# Patient Record
Sex: Male | Born: 1980 | Race: Black or African American | Hispanic: No | Marital: Married | Smoking: Never smoker
Health system: Southern US, Community
[De-identification: ages and names within clinical notes are randomized; demographics above are authoritative.]

## PROBLEM LIST (undated history)

## (undated) DIAGNOSIS — R011 Cardiac murmur, unspecified: Secondary | ICD-10-CM

## (undated) DIAGNOSIS — I10 Essential (primary) hypertension: Secondary | ICD-10-CM

---

## 2004-09-24 ENCOUNTER — Emergency Department (HOSPITAL_COMMUNITY): Admission: EM | Admit: 2004-09-24 | Discharge: 2004-09-24 | Payer: Self-pay

## 2004-11-23 ENCOUNTER — Emergency Department (HOSPITAL_COMMUNITY): Admission: EM | Admit: 2004-11-23 | Discharge: 2004-11-23 | Payer: Self-pay | Admitting: Emergency Medicine

## 2012-05-30 ENCOUNTER — Encounter (HOSPITAL_COMMUNITY): Payer: Self-pay | Admitting: Emergency Medicine

## 2012-05-30 ENCOUNTER — Emergency Department (HOSPITAL_COMMUNITY)
Admission: EM | Admit: 2012-05-30 | Discharge: 2012-05-31 | Disposition: A | Discharge status: Home / Self Care | Payer: Self-pay | Attending: Emergency Medicine | Admitting: Emergency Medicine

## 2012-05-30 DIAGNOSIS — X500XXA Overexertion from strenuous movement or load, initial encounter: Secondary | ICD-10-CM | POA: Insufficient documentation

## 2012-05-30 DIAGNOSIS — M940 Chondrocostal junction syndrome [Tietze]: Secondary | ICD-10-CM | POA: Insufficient documentation

## 2012-05-30 HISTORY — DX: Cardiac murmur, unspecified: R01.1

## 2012-05-30 HISTORY — DX: Essential (primary) hypertension: I10

## 2012-05-30 LAB — URINALYSIS, ROUTINE W REFLEX MICROSCOPIC
Ketones, ur: NEGATIVE mg/dL
Leukocytes, UA: NEGATIVE
Protein, ur: NEGATIVE mg/dL
Urobilinogen, UA: 1 mg/dL (ref 0.0–1.0)

## 2012-05-30 NOTE — ED Notes (Signed)
Pt states he is having chest pain in the middle of his chest that started about 45 mins ago  Pt describes it as a soreness and states he has had some dizziness associated with the pain  Pt has hx of hypertension but has not been checked for it in a while

## 2012-05-31 ENCOUNTER — Emergency Department (HOSPITAL_COMMUNITY): Payer: Self-pay

## 2012-05-31 MED ORDER — IBUPROFEN 800 MG PO TABS: 800.0000 mg | ORAL_TABLET | Freq: Three times a day (TID) | ORAL | Status: DC

## 2012-05-31 MED ORDER — KETOROLAC TROMETHAMINE 60 MG/2ML IM SOLN
60.0000 mg | Freq: Once | INTRAMUSCULAR | Status: AC
  Administered 2012-05-31: 60 mg via INTRAMUSCULAR
  Filled 2012-05-31: qty 2

## 2012-05-31 NOTE — ED Provider Notes (Signed)
History     CSN: [id number]  Arrival date & time 05/30/12  2242   First MD Initiated Contact with Patient 05/30/12 2305      Chief Complaint  Patient presents with  . Chest Pain    (Consider location/radiation/quality/duration/timing/severity/associated sxs/prior treatment) HPI History provided by pt.   Pt had acute onset pressure-like pain at LSB at rest approx 2 hours ago.  Pain has been intermittent ever since.  It is not seem to be associated w/ movement, position or eating and is non-pleuritic.  Has also had SOB.  Denies fever, cough, abd pain, nausea, sweats.  Pt has h/o diet-controlled HTN.  He does not smoke and no FH early MI.  No RF for PE and denies LE pain/edema.  No recent trauma.  Most recently lifted weights at gym 2 days ago.  Past Medical History  Diagnosis Date  . Hypertension   . Heart murmur     History reviewed. No pertinent past surgical history.  Family History  Problem Relation Age of Onset  . Hypertension Other   . Diabetes Other     History  Substance Use Topics  . Smoking status: Never Smoker   . Smokeless tobacco: Not on file  . Alcohol Use: No      Review of Systems  All other systems reviewed and are negative.    Allergies  Review of patient's allergies indicates no known allergies.  Home Medications  No current outpatient prescriptions on file.  BP 165/84  Pulse 75  Temp 99.1 F (37.3 C) (Oral)  Resp 20  SpO2 100%  Physical Exam  Nursing note and vitals reviewed. Constitutional: He is oriented to person, place, and time. He appears well-developed and well-nourished. No distress.  HENT:  Head: Normocephalic and atraumatic.  Eyes:       Normal appearance  Neck: Normal range of motion.  Cardiovascular: Normal rate, regular rhythm and intact distal pulses.   Pulmonary/Chest: Effort normal and breath sounds normal. No respiratory distress.       No pleuritic pain reported.  Tenderness at LSB.  Mild increase in pain w/  twisting of torso.  Abdominal: Soft. Bowel sounds are normal. He exhibits no distension. There is no tenderness. There is no guarding.  Musculoskeletal: Normal range of motion.       No peripheral edema or calf tenderness  Neurological: He is alert and oriented to person, place, and time.  Skin: Skin is warm and dry. No rash noted.  Psychiatric: He has a normal mood and affect. His behavior is normal.    ED Course  Procedures (including critical care time)   Date: 05/31/2012  Rate: 67  Rhythm: normal sinus rhythm  QRS Axis: normal  Intervals: normal  ST/T Wave abnormalities: normal  Conduction Disutrbances:none  Narrative Interpretation:   Old EKG Reviewed: none available    Labs Reviewed  URINALYSIS, ROUTINE W REFLEX MICROSCOPIC   Dg Chest 2 View  05/31/2012  *RADIOLOGY REPORT*  Clinical Data: Shortness of breath, hypertension.  CHEST - 2 VIEW  Comparison: None.  Findings: Lungs clear.  Heart size and pulmonary vascularity normal.  No effusion.  Visualized bones unremarkable.  IMPRESSION: No acute disease   Original Report Authenticated By: Thora Lance III, M.D.      1. Costochondritis, acute       MDM  Healthy 31yo M presents w/ non-exertional CP and SOB x 2 hours.  Low risk ACS and no RF for or exam findings consistent w/ PE.  Recently lifted weights.  Pain reproducible w/ palpation at LSB as well as twisting of torso on exam.  EKG non-ischemic.  CXR pending.  Suspect costochondritis.  60mg  IM toradol ordered.  Will reassess shortly.  12:24 AM   CXR neg.  All results discussed w/ pt.  He reports that his pain is much improved w/ toradol.  VSS.  Will treat symptomatically w/ rest, ice and NSAID.  Strict return precautions discussed.  1:21 AM        Otilio Miu, PA 05/31/12 [phone]

## 2012-05-31 NOTE — ED Notes (Signed)
PA at bedside.

## 2012-05-31 NOTE — ED Provider Notes (Signed)
Medical screening examination/treatment/procedure(s) were performed by non-physician practitioner and as supervising physician I was immediately available for consultation/collaboration.   Hanley Seamen, MD 05/31/12 [phone]

## 2012-10-20 ENCOUNTER — Emergency Department (HOSPITAL_COMMUNITY)
Admission: EM | Admit: 2012-10-20 | Discharge: 2012-10-21 | Disposition: A | Discharge status: Home / Self Care | Payer: 59 | Attending: Emergency Medicine | Admitting: Emergency Medicine

## 2012-10-20 ENCOUNTER — Encounter (HOSPITAL_COMMUNITY): Payer: Self-pay | Admitting: *Deleted

## 2012-10-20 DIAGNOSIS — R5383 Other fatigue: Secondary | ICD-10-CM | POA: Insufficient documentation

## 2012-10-20 DIAGNOSIS — IMO0001 Reserved for inherently not codable concepts without codable children: Secondary | ICD-10-CM

## 2012-10-20 DIAGNOSIS — R011 Cardiac murmur, unspecified: Secondary | ICD-10-CM | POA: Insufficient documentation

## 2012-10-20 DIAGNOSIS — R209 Unspecified disturbances of skin sensation: Secondary | ICD-10-CM | POA: Insufficient documentation

## 2012-10-20 DIAGNOSIS — R5381 Other malaise: Secondary | ICD-10-CM | POA: Insufficient documentation

## 2012-10-20 DIAGNOSIS — I10 Essential (primary) hypertension: Secondary | ICD-10-CM | POA: Insufficient documentation

## 2012-10-20 NOTE — ED Provider Notes (Signed)
History  This chart was scribed for Ebbie Ridge, non-physician practitioner, working with Celene Kras, MD by Bennett Scrape, ED Scribe. This patient was seen in room WTR8/WTR8 and the patient's care was started at 11:10 PM.  CSN: [id number]  Arrival date & time 10/20/12  2151   First MD Initiated Contact with Patient 10/20/12 2310      Chief Complaint  Patient presents with  . Hypertension     The history is provided by the patient. No language interpreter was used.    Cody Lin is a 32 y.o. male who presents to the Emergency Department complaining of HTN of 193/73 that he noticed at a pharmacy this evening with associated hand tingling and fatigued over the past few days. He denies having a h/o HTN and denies being on HTN medications currently. He denies having a PCP currently and denies having followed up with a MD over the past few years. He denies CP, SOB, HA, urinary symptoms and visual disturbance as associated symptoms. He does not have a h/o chronic medical conditions and denies smoking and alcohol use.    Past Medical History  Diagnosis Date  . Hypertension   . Heart murmur     History reviewed. No pertinent past surgical history.  Family History  Problem Relation Age of Onset  . Hypertension Other   . Diabetes Other     History  Substance Use Topics  . Smoking status: Never Smoker   . Smokeless tobacco: Not on file  . Alcohol Use: No      Review of Systems  A complete 10 system review of systems was obtained and all systems are negative except as noted in the HPI and PMH.   Allergies  Review of patient's allergies indicates no known allergies.  Home Medications   Current Outpatient Rx  Name  Route  Sig  Dispense  Refill  . ibuprofen (ADVIL,MOTRIN) 800 MG tablet   Oral   Take 400 mg by mouth every 6 (six) hours as needed. Pain           Triage Vitals: BP 164/91  Pulse 71  Temp(Src) 98.8 F (37.1 C) (Oral)  Resp 18  Ht 5\' 9"  (1.753 m)   Wt 178 lb (80.74 kg)  BMI 26.27 kg/m2  SpO2 100%  Physical Exam  Nursing note and vitals reviewed. Constitutional: He is oriented to person, place, and time. He appears well-developed and well-nourished. No distress.  HENT:  Head: Normocephalic and atraumatic.  Mouth/Throat: Oropharynx is clear and moist.  Eyes: Conjunctivae and EOM are normal. Pupils are equal, round, and reactive to light.  Neck: Neck supple. No tracheal deviation present.  Cardiovascular: Normal rate and regular rhythm.   Pulmonary/Chest: Effort normal and breath sounds normal. No respiratory distress.  Abdominal: Soft. There is no tenderness.  Musculoskeletal: Normal range of motion.  Neurological: He is alert and oriented to person, place, and time.  Skin: Skin is warm and dry.  Psychiatric: He has a normal mood and affect. His behavior is normal.    ED Course  Procedures (including critical care time)  DIAGNOSTIC STUDIES: Oxygen Saturation is 100% on room air, normal by my interpretation.    COORDINATION OF CARE: 11:35 PM-Discussed treatment plan which includes CBC with pt at bedside and pt agreed to plan. Advised pt that this is a problem that he needs to follow up with a PCP for and will proved pt with a list of resources. Also advised pt to walk three  times a week and watch salt content.   Filed Vitals:   10/20/12 2203  BP: 164/91  Pulse: 71  Temp: 98.8 F (37.1 C)  TempSrc: Oral  Resp: 18  Height: 5\' 9"  (1.753 m)  Weight: 178 lb (80.74 kg)  SpO2: 100%   The patient is advised to have follow up with a PCP. The patient is advised to return here as needed. The patient does not have any signs of hypertensive crisis or emergency. Told to start moderate exercise.    MDM  I personally performed the services described in this documentation, which was scribed in my presence. The recorded information has been reviewed and is accurate.      Carlyle Dolly, PA-C 10/21/12 [phone]

## 2012-10-20 NOTE — ED Notes (Signed)
Pt reports noting a BP 193/73 PTA while at a pharmacy this evening - pt states since starting Saturday he has been experiencing intermittent generalized body tingling.

## 2012-10-21 LAB — URINALYSIS, ROUTINE W REFLEX MICROSCOPIC
Bilirubin Urine: NEGATIVE
Glucose, UA: NEGATIVE mg/dL
Hgb urine dipstick: NEGATIVE
Ketones, ur: NEGATIVE mg/dL
Leukocytes, UA: NEGATIVE
Nitrite: NEGATIVE
Protein, ur: NEGATIVE mg/dL
Specific Gravity, Urine: 1.025 (ref 1.005–1.030)
Urobilinogen, UA: 1 mg/dL (ref 0.0–1.0)
pH: 6.5 (ref 5.0–8.0)

## 2012-10-21 LAB — POCT I-STAT, CHEM 8
BUN: 16 mg/dL (ref 6–23)
Calcium, Ion: 1.23 mmol/L (ref 1.12–1.23)
Chloride: 103 mEq/L (ref 96–112)
Creatinine, Ser: 1.1 mg/dL (ref 0.50–1.35)
Glucose, Bld: 93 mg/dL (ref 70–99)
HCT: 49 % (ref 39.0–52.0)
Hemoglobin: 16.7 g/dL (ref 13.0–17.0)
Potassium: 4 mEq/L (ref 3.5–5.1)
Sodium: 143 mEq/L (ref 135–145)
TCO2: 33 mmol/L (ref 0–100)

## 2012-10-21 NOTE — ED Provider Notes (Signed)
Medical screening examination/treatment/procedure(s) were performed by non-physician practitioner and as supervising physician I was immediately available for consultation/collaboration.    Celene Kras, MD 10/21/12 [phone]

## 2013-08-18 IMAGING — CR DG CHEST 2V
2 series · 2 of 2 positions shown · non-contrast
Comparison: None.

CLINICAL DATA: Shortness of breath, hypertension.

CHEST - 2 VIEW

[w chest pa]
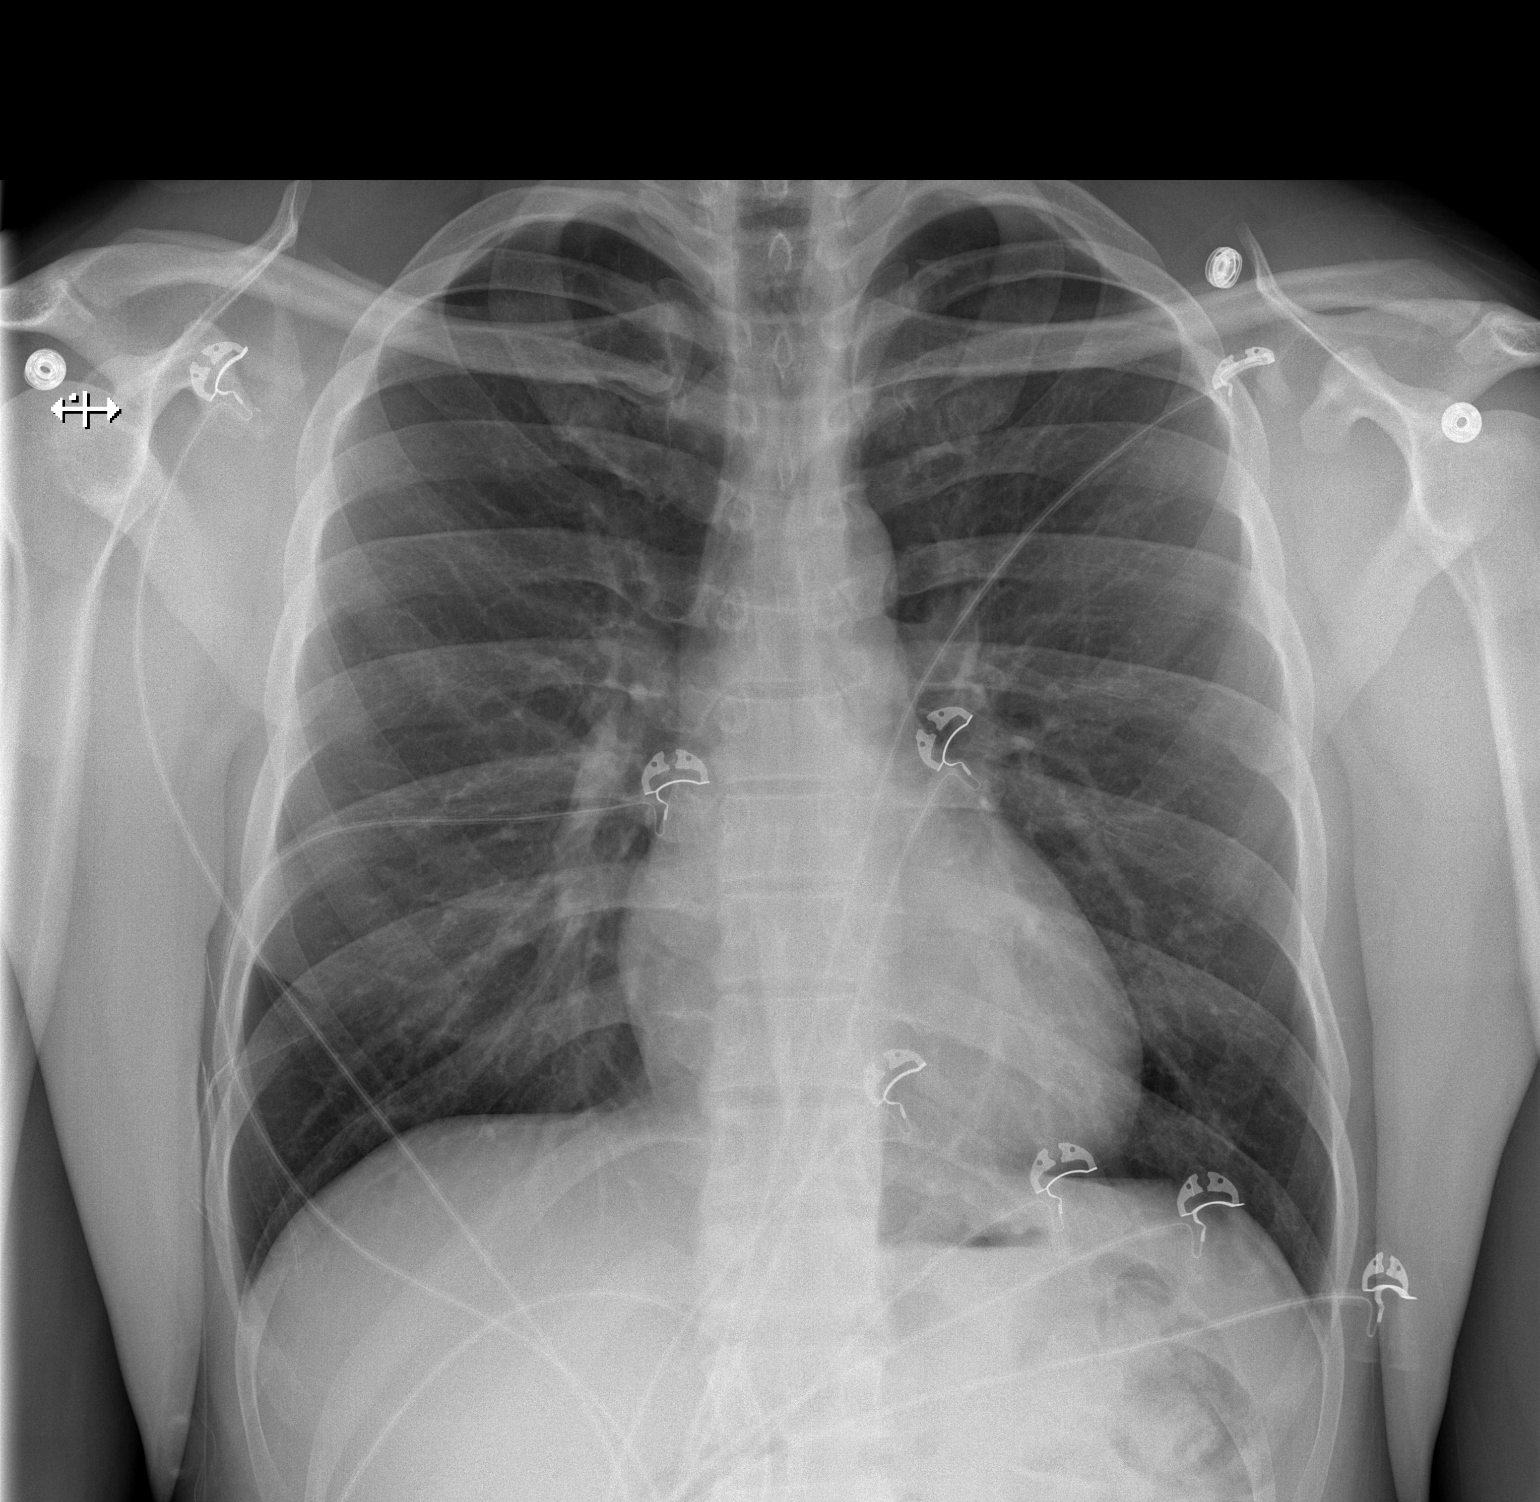

[w chest lat]
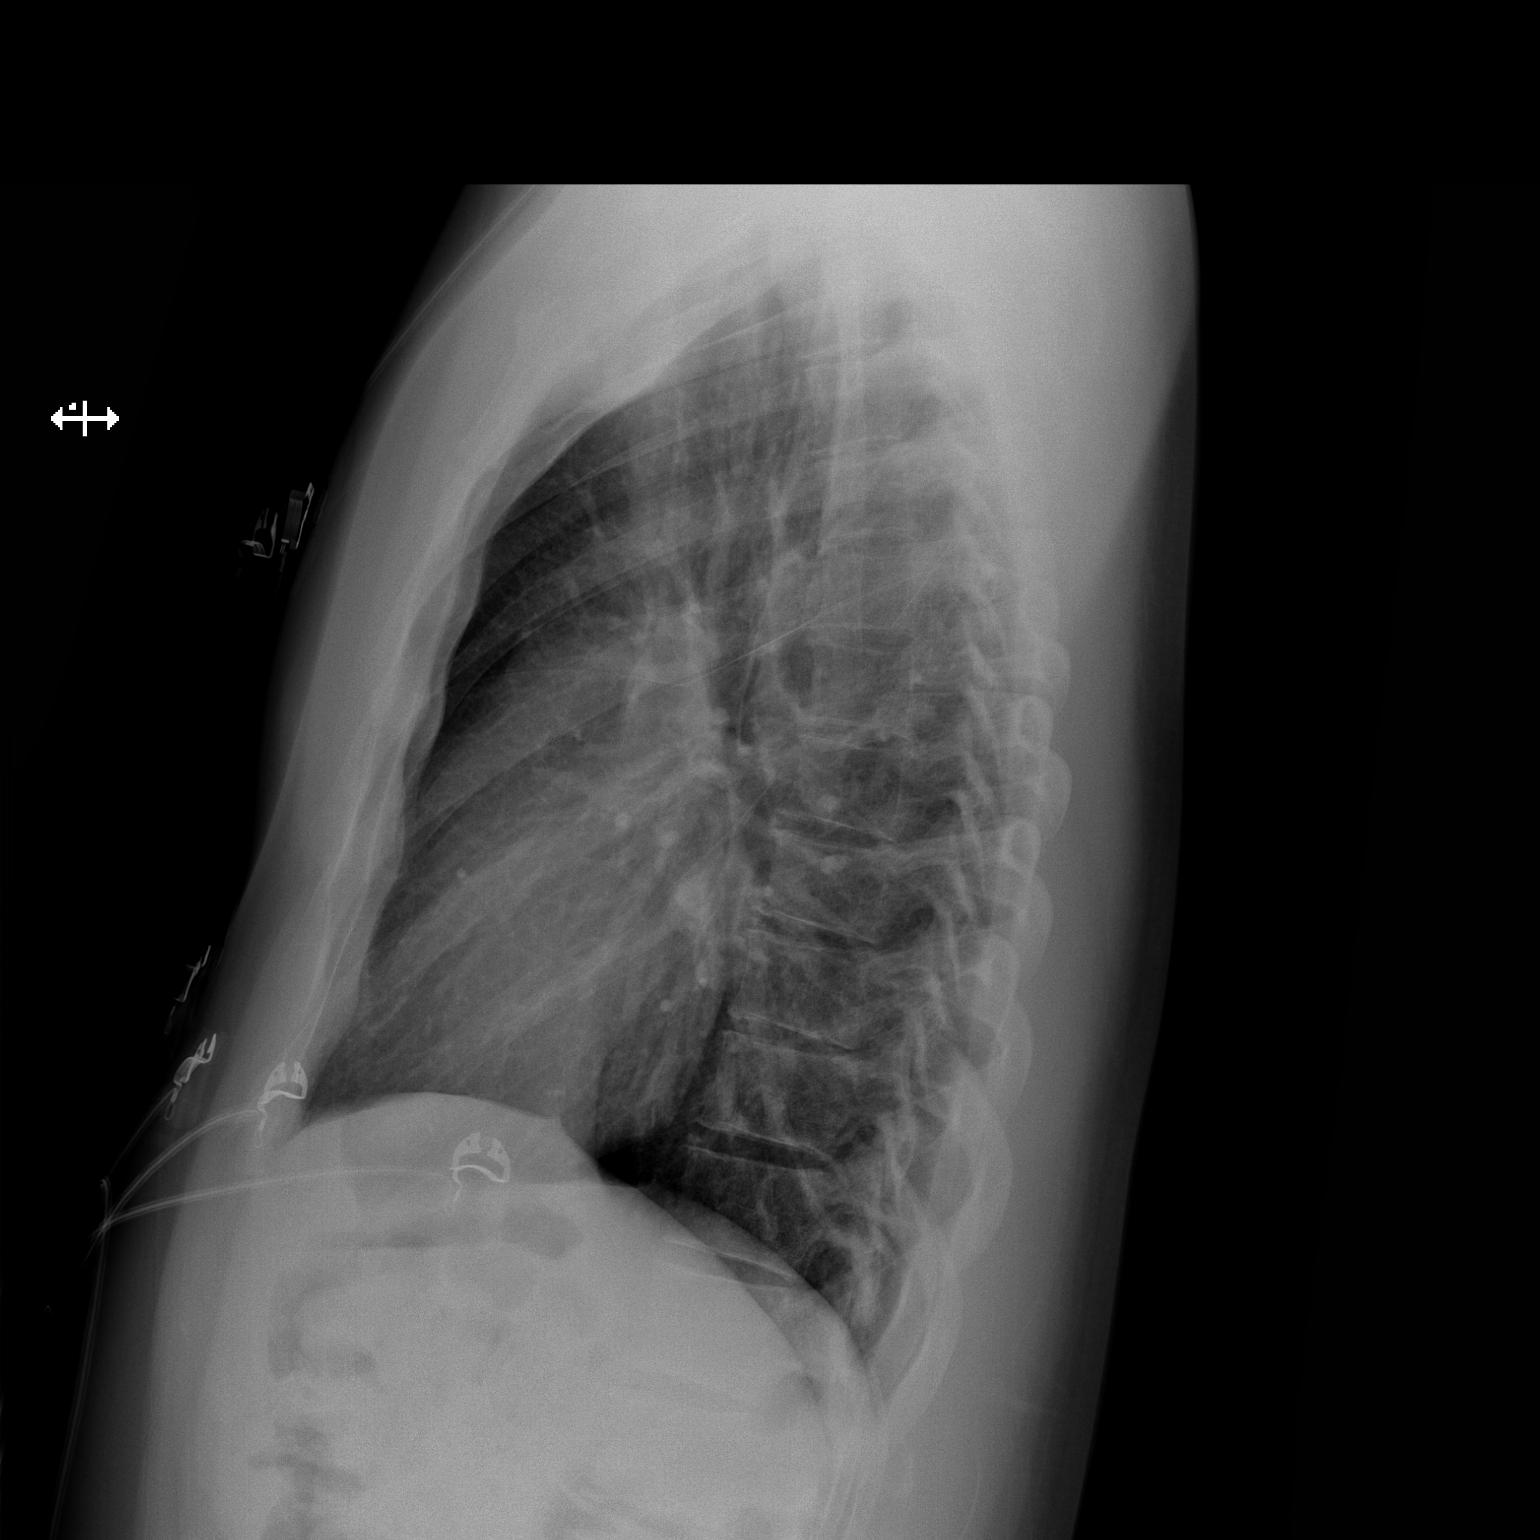

[2 of 2 positions shown; findings below may reference images not displayed]

FINDINGS: Lungs clear.  Heart size and pulmonary vascularity
normal.  No effusion.  Visualized bones unremarkable.
IMPRESSION: No acute disease

## 2014-04-27 ENCOUNTER — Emergency Department (HOSPITAL_COMMUNITY)
Admission: EM | Admit: 2014-04-27 | Discharge: 2014-04-27 | Disposition: A | Discharge status: Home / Self Care | Payer: 59 | Attending: Emergency Medicine | Admitting: Emergency Medicine

## 2014-04-27 ENCOUNTER — Emergency Department (HOSPITAL_COMMUNITY): Payer: 59

## 2014-04-27 ENCOUNTER — Encounter (HOSPITAL_COMMUNITY): Payer: Self-pay | Admitting: Emergency Medicine

## 2014-04-27 DIAGNOSIS — Z79899 Other long term (current) drug therapy: Secondary | ICD-10-CM | POA: Insufficient documentation

## 2014-04-27 DIAGNOSIS — I10 Essential (primary) hypertension: Secondary | ICD-10-CM | POA: Insufficient documentation

## 2014-04-27 DIAGNOSIS — R079 Chest pain, unspecified: Secondary | ICD-10-CM | POA: Insufficient documentation

## 2014-04-27 DIAGNOSIS — R011 Cardiac murmur, unspecified: Secondary | ICD-10-CM | POA: Diagnosis not present

## 2014-04-27 DIAGNOSIS — R071 Chest pain on breathing: Secondary | ICD-10-CM | POA: Insufficient documentation

## 2014-04-27 DIAGNOSIS — R0789 Other chest pain: Secondary | ICD-10-CM

## 2014-04-27 LAB — I-STAT TROPONIN, ED
TROPONIN I, POC: 0 ng/mL (ref 0.00–0.08)
Troponin i, poc: 0 ng/mL (ref 0.00–0.08)

## 2014-04-27 LAB — URINALYSIS, ROUTINE W REFLEX MICROSCOPIC
Bilirubin Urine: NEGATIVE
Glucose, UA: NEGATIVE mg/dL
Hgb urine dipstick: NEGATIVE
Ketones, ur: NEGATIVE mg/dL
Leukocytes, UA: NEGATIVE
Nitrite: NEGATIVE
Protein, ur: NEGATIVE mg/dL
Specific Gravity, Urine: 1.02 (ref 1.005–1.030)
Urobilinogen, UA: 0.2 mg/dL (ref 0.0–1.0)
pH: 7 (ref 5.0–8.0)

## 2014-04-27 LAB — BASIC METABOLIC PANEL
ANION GAP: 13 (ref 5–15)
BUN: 16 mg/dL (ref 6–23)
CO2: 28 meq/L (ref 19–32)
Calcium: 9.8 mg/dL (ref 8.4–10.5)
Chloride: 102 mEq/L (ref 96–112)
Creatinine, Ser: 1.13 mg/dL (ref 0.50–1.35)
GFR calc Af Amer: >90 mL/min (ref >90)
GFR calc non Af Amer: 83 mL/min — ABNORMAL LOW (ref >90)
GLUCOSE: 103 mg/dL — AB (ref 70–99)
POTASSIUM: 4.6 meq/L (ref 3.7–5.3)
SODIUM: 143 meq/L (ref 137–147)

## 2014-04-27 LAB — CBC
HCT: 48.9 % (ref 39.0–52.0)
Hemoglobin: 16.4 g/dL (ref 13.0–17.0)
MCH: 30.1 pg (ref 26.0–34.0)
MCHC: 33.5 g/dL (ref 30.0–36.0)
MCV: 89.9 fL (ref 78.0–100.0)
PLATELETS: 155 10*3/uL (ref 150–400)
RBC: 5.44 MIL/uL (ref 4.22–5.81)
RDW: 12.6 % (ref 11.5–15.5)
WBC: 7.2 10*3/uL (ref 4.0–10.5)

## 2014-04-27 MED ORDER — IBUPROFEN 800 MG PO TABS: 800.0000 mg | ORAL_TABLET | Freq: Three times a day (TID) | ORAL | Status: AC | PRN

## 2014-04-27 NOTE — ED Provider Notes (Signed)
Medical screening examination/treatment/procedure(s) were performed by non-physician practitioner and as supervising physician I was immediately available for consultation/collaboration.   EKG Interpretation   Date/Time:  Tuesday April 27 2014 11:40:49 EDT Ventricular Rate:  87 PR Interval:  134 QRS Duration: 101 QT Interval:  351 QTC Calculation: 422 R Axis:   73 Text Interpretation:  Sinus rhythm ST elev, probable normal early repol  pattern Baseline wander in lead(s) V2 V5 No significant change since last  tracing Confirmed by [facility]  MD, Quention Mcneill [phone]) on 04/27/2014 1:41:23 PM       Ethelda Chick, MD 04/27/14 1558

## 2014-04-27 NOTE — ED Provider Notes (Signed)
CSN: [id number]     Arrival date & time 04/27/14  1129 History   First MD Initiated Contact with Patient 04/27/14 1154     Chief Complaint  Patient presents with  . Chest Pain     (Consider location/radiation/quality/duration/timing/severity/associated sxs/prior Treatment) HPI Patient presents to the emergency department with right-sided chest pressure that started earlier today.  The patient, states, that it lasted about 15 minutes.  He states, that he still feels some pressure, but not significantly, like earlier.  Patient, states, that movement and palpation make the discomfort worse patient denies weakness, numbness, dizziness, headache, blurred vision, back pain, diaphoresis, back pain, neck pain, nausea, vomiting, diarrhea, abdominal pain, fever, cough, rash, body aches, or syncope.  The patient, states he did not take any medications prior to arrival.  Patient, states nothing seems make his condition, better Past Medical History  Diagnosis Date  . Hypertension   . Heart murmur    History reviewed. No pertinent past surgical history. Family History  Problem Relation Age of Onset  . Hypertension Other   . Diabetes Other    History  Substance Use Topics  . Smoking status: Never Smoker   . Smokeless tobacco: Not on file  . Alcohol Use: No    Review of Systems All other systems negative except as documented in the HPI. All pertinent positives and negatives as reviewed in the HPI.   Allergies  Review of patient's allergies indicates no known allergies.  Home Medications   Prior to Admission medications   Medication Sig Start Date End Date Taking? Authorizing Provider  cholecalciferol (VITAMIN D) 1000 UNITS tablet Take 1,000 Units by mouth daily.   Yes Historical Provider, MD  Omega-3 Fatty Acids (FISH OIL PO) Take 1 capsule by mouth daily.   Yes Historical Provider, MD   BP 128/68  Pulse 59  Temp(Src) 98.8 F (37.1 C) (Oral)  Resp 14  SpO2 100% Physical Exam   Nursing note and vitals reviewed. Constitutional: He is oriented to person, place, and time. He appears well-developed and well-nourished. No distress.  HENT:  Head: Normocephalic and atraumatic.  Mouth/Throat: Oropharynx is clear and moist.  Eyes: Pupils are equal, round, and reactive to light.  Neck: Normal range of motion. Neck supple.  Cardiovascular: Normal rate, regular rhythm and normal heart sounds.  Exam reveals no gallop and no friction rub.   No murmur heard. Pulmonary/Chest: Effort normal and breath sounds normal. No respiratory distress. He exhibits tenderness.  Abdominal: Soft. Bowel sounds are normal. He exhibits no distension. There is no tenderness.  Musculoskeletal: He exhibits no edema.  Neurological: He is alert and oriented to person, place, and time. No cranial nerve deficit. He exhibits normal muscle tone. Coordination normal.  Skin: Skin is warm and dry. No rash noted. No erythema.    ED Course  Procedures (including critical care time) Labs Review Labs Reviewed  BASIC METABOLIC PANEL - Abnormal; Notable for the following:    Glucose, Bld 103 (*)    GFR calc non Af Amer 83 (*)    All other components within normal limits  URINALYSIS, ROUTINE W REFLEX MICROSCOPIC - Abnormal; Notable for the following:    APPearance CLOUDY (*)    All other components within normal limits  CBC  I-STAT TROPOININ, ED  I-STAT TROPOININ, ED    Imaging Review Dg Chest 2 View  04/27/2014   CLINICAL DATA:  Chest pain and shortness of breath with history of hypertension and heart murmur  EXAM: CHEST  2 VIEW  COMPARISON:  PA and lateral chest of May 31, 2012  FINDINGS: The lungs are mildly hyperinflated. There is no focal infiltrate. The heart and mediastinal structures are normal. There is no pleural effusion or pneumothorax. The bony thorax is unremarkable.  IMPRESSION: Hyperinflation consistent with COPD or reactive airway disease. There is no evidence of pneumonia nor other acute  cardiopulmonary abnormality.   Electronically Signed   By: David  [place]   On: 04/27/2014 13:32     EKG Interpretation   Date/Time:  Tuesday April 27 2014 11:40:49 EDT Ventricular Rate:  87 PR Interval:  134 QRS Duration: 101 QT Interval:  351 QTC Calculation: 422 R Axis:   73 Text Interpretation:  Sinus rhythm ST elev, probable normal early repol  pattern Baseline wander in lead(s) V2 V5 No significant change since last  tracing Confirmed by [facility]  MD, MARTHA [phone]) on 04/27/2014 1:41:23 PM      Patient is PERC negative and low risk, based on Wells criteria.  The pain, seems to be reproducible on the right side of his chest.  Patient, states, that this pain is mostly gone and has been, since he's been here in the emergency department.  This mainly seems to be musculoskeletal chest pain, based on his history of present illness, and Physical exam, along with test results  Carlyle Dolly, PA-C 04/27/14 1550

## 2014-04-27 NOTE — Discharge Instructions (Signed)
Your testing here today, was normal.  Return here as needed.  Followup with her primary care Dr. or urgent care

## 2014-04-27 NOTE — ED Notes (Signed)
ptwas riding in the car 1 hour ago and began having chest pain Pressure to epig area. No n/v/d no diaphoretic, hx of murmur and had seen a cardiology a while back and it was doing fine. Pain radates to bil lower back pain with the chest pain. Was on lisinorpril and was stopped due to bp was ok.

## 2021-09-13 ENCOUNTER — Encounter (HOSPITAL_BASED_OUTPATIENT_CLINIC_OR_DEPARTMENT_OTHER): Payer: Self-pay | Admitting: Urology

## 2021-09-13 ENCOUNTER — Other Ambulatory Visit: Payer: Self-pay

## 2021-09-13 ENCOUNTER — Emergency Department (HOSPITAL_BASED_OUTPATIENT_CLINIC_OR_DEPARTMENT_OTHER): Payer: Self-pay

## 2021-09-13 ENCOUNTER — Emergency Department (HOSPITAL_BASED_OUTPATIENT_CLINIC_OR_DEPARTMENT_OTHER)
Admission: EM | Admit: 2021-09-13 | Discharge: 2021-09-13 | Disposition: A | Discharge status: Home / Self Care | Payer: Self-pay | Attending: Emergency Medicine | Admitting: Emergency Medicine

## 2021-09-13 DIAGNOSIS — R42 Dizziness and giddiness: Secondary | ICD-10-CM

## 2021-09-13 DIAGNOSIS — I10 Essential (primary) hypertension: Secondary | ICD-10-CM | POA: Insufficient documentation

## 2021-09-13 LAB — COMPREHENSIVE METABOLIC PANEL
ALT: 21 U/L (ref 0–44)
AST: 24 U/L (ref 15–41)
Albumin: 4.4 g/dL (ref 3.5–5.0)
Alkaline Phosphatase: 44 U/L (ref 38–126)
Anion gap: 9 (ref 5–15)
BUN: 22 mg/dL — ABNORMAL HIGH (ref 6–20)
CO2: 25 mmol/L (ref 22–32)
Calcium: 9.1 mg/dL (ref 8.9–10.3)
Chloride: 103 mmol/L (ref 98–111)
Creatinine, Ser: 1.1 mg/dL (ref 0.61–1.24)
GFR, Estimated: >60 mL/min (ref >60)
Glucose, Bld: 113 mg/dL — ABNORMAL HIGH (ref 70–99)
Potassium: 4 mmol/L (ref 3.5–5.1)
Sodium: 137 mmol/L (ref 135–145)
Total Bilirubin: 0.4 mg/dL (ref 0.3–1.2)
Total Protein: 7.4 g/dL (ref 6.5–8.1)

## 2021-09-13 LAB — CBC WITH DIFFERENTIAL/PLATELET
Abs Immature Granulocytes: 0.02 10*3/uL (ref 0.00–0.07)
Basophils Absolute: 0.1 10*3/uL (ref 0.0–0.1)
Basophils Relative: 1 %
Eosinophils Absolute: 0.2 10*3/uL (ref 0.0–0.5)
Eosinophils Relative: 2 %
HCT: 46.4 % (ref 39.0–52.0)
Hemoglobin: 15.6 g/dL (ref 13.0–17.0)
Immature Granulocytes: 0 %
Lymphocytes Relative: 19 %
Lymphs Abs: 1.7 10*3/uL (ref 0.7–4.0)
MCH: 29.8 pg (ref 26.0–34.0)
MCHC: 33.6 g/dL (ref 30.0–36.0)
MCV: 88.5 fL (ref 80.0–100.0)
Monocytes Absolute: 0.5 10*3/uL (ref 0.1–1.0)
Monocytes Relative: 6 %
Neutro Abs: 6.6 10*3/uL (ref 1.7–7.7)
Neutrophils Relative %: 72 %
Platelets: 161 10*3/uL (ref 150–400)
RBC: 5.24 MIL/uL (ref 4.22–5.81)
RDW: 12.8 % (ref 11.5–15.5)
WBC: 9 10*3/uL (ref 4.0–10.5)
nRBC: 0 % (ref 0.0–0.2)

## 2021-09-13 LAB — TROPONIN I (HIGH SENSITIVITY)
Troponin I (High Sensitivity): 6 ng/L (ref <18)
Troponin I (High Sensitivity): 9 ng/L (ref <18)

## 2021-09-13 MED ORDER — IOHEXOL 350 MG/ML SOLN
80.0000 mL | Freq: Once | INTRAVENOUS | Status: AC | PRN
  Administered 2021-09-13: 80 mL via INTRAVENOUS

## 2021-09-13 MED ORDER — HYDRALAZINE HCL 20 MG/ML IJ SOLN
INTRAMUSCULAR | Status: AC
  Filled 2021-09-13: qty 1

## 2021-09-13 MED ORDER — CLONIDINE HCL 0.2 MG PO TABS: 0.2000 mg | ORAL_TABLET | Freq: Every day | ORAL | 0 refills | Status: AC | PRN

## 2021-09-13 MED ORDER — HYDRALAZINE HCL 20 MG/ML IJ SOLN
5.0000 mg | Freq: Once | INTRAMUSCULAR | Status: AC
Start: 2021-09-13 — End: 2021-09-13
  Administered 2021-09-13: 5 mg via INTRAVENOUS

## 2021-09-13 NOTE — ED Triage Notes (Signed)
Pt states dizziness and light headedness approx 1 hr pta, states left arm pain at time of episode but not currently H/o HTN, BP at home was 190/106

## 2021-09-13 NOTE — Discharge Instructions (Addendum)
Your blood pressure was elevated on arrival and is normal now.  Please take clonidine if blood pressures greater than 160  Your CT scans and lab work were unremarkable today.  See your doctor for follow-up in a week to recheck your blood pressure  Return to ER if you have worse dizziness, chest pain, persistent hypertension

## 2021-09-13 NOTE — ED Notes (Signed)
Discharge instructions discussed with pt. Pt verbalized understanding. Pt stable and ambulatory.  °

## 2021-09-13 NOTE — ED Provider Notes (Signed)
[facility] EMERGENCY DEPARTMENT Provider Note   CSN: [id number] Arrival date & time: 09/13/21  1944     History  Chief Complaint  Patient presents with   Dizziness    Cody Lin is a 41 y.o. male here presenting with dizziness and hypertension and left-sided arm pain.  Patient states that he was at his son's jujitsu class around 6:00.  He states that he has sudden onset of headache and dizziness when he was driving back.  He then had some left arm pain.  Denies any numbness or weakness.  Patient took his blood pressure and it was elevated around 190.  Patient has no history of hypertension and is not on any BP meds.  Patient denies any chest pain or back pain or abdominal pain.  The history is provided by the patient.      Home Medications Prior to Admission medications   Medication Sig Start Date End Date Taking? Authorizing Provider  cholecalciferol (VITAMIN D) 1000 UNITS tablet Take 1,000 Units by mouth daily.    [provider]  ibuprofen (ADVIL,MOTRIN) 800 MG tablet Take 1 tablet (800 mg total) by mouth every 8 (eight) hours as needed. 04/27/14   Lawyer, Cristal Deer, PA-C  Omega-3 Fatty Acids (FISH OIL PO) Take 1 capsule by mouth daily.    [provider]      Allergies    Patient has no known allergies.    Review of Systems   Review of Systems  Neurological:  Positive for dizziness.  All other systems reviewed and are negative.  Physical Exam Updated Vital Signs BP (!) 162/92 (BP Location: Left Arm)    Pulse 69    Temp 98.1 F (36.7 C) (Oral)    Resp 18    Ht 5\' 9"  (1.753 m)    Wt 83.9 kg    SpO2 100%    BMI 27.32 kg/m  Physical Exam Vitals and nursing note reviewed.  Constitutional:      Appearance: Normal appearance.  HENT:     Head: Normocephalic.     Nose: Nose normal.     Mouth/Throat:     Mouth: Mucous membranes are moist.  Eyes:     Extraocular Movements: Extraocular movements intact.     Pupils: Pupils are equal, round,  and reactive to light.  Cardiovascular:     Rate and Rhythm: Normal rate and regular rhythm.     Pulses: Normal pulses.     Heart sounds: Normal heart sounds.  Pulmonary:     Effort: Pulmonary effort is normal.     Breath sounds: Normal breath sounds.  Abdominal:     General: Abdomen is flat.     Palpations: Abdomen is soft.  Musculoskeletal:        General: Normal range of motion.     Cervical back: Normal range of motion and neck supple.     Comments: Good peripheral pulses throughout  Skin:    General: Skin is warm.     Capillary Refill: Capillary refill takes less than 2 seconds.  Neurological:     General: No focal deficit present.     Mental Status: He is alert and oriented to person, place, and time.  Psychiatric:        Mood and Affect: Mood normal.        Behavior: Behavior normal.    ED Results / Procedures / Treatments   Labs (all labs ordered are listed, but only abnormal results are displayed) Labs Reviewed  CBC WITH DIFFERENTIAL/PLATELET  COMPREHENSIVE METABOLIC PANEL  TROPONIN I (HIGH SENSITIVITY)    EKG EKG Interpretation  Date/Time:  Wednesday September 13 2021 19:56:10 EST Ventricular Rate:  60 PR Interval:  157 QRS Duration: 109 QT Interval:  401 QTC Calculation: 401 R Axis:   27 Text Interpretation: Sinus rhythm ST elev, probable normal early repol pattern No significant change since last tracing Confirmed by Richardean Canal [phone]) on 09/13/2021 8:08:13 PM  Radiology No results found.  Procedures Procedures    Medications Ordered in ED Medications  hydrALAZINE (APRESOLINE) injection 5 mg (has no administration in time range)    ED Course/ Medical Decision Making/ A&P                           Medical Decision Making Cody Lin is a 41 y.o. male here presenting with sudden onset of hypertension and dizziness and left arm pain.  Likely symptomatic hypertension but also consider hypertensive urgency versus emergency and also carotid  dissection and also ACS.  Plan to get CBC and CMP and troponin x2, CTA head and neck.  Plan to give IV hydralazine for elevated blood pressure.  11:09 PM Patient blood pressure is down to the low 100s now.  Troponin negative x2.  CTA showed no dissection.  Patient is stable for discharge now.  Since patient's blood pressure is normal now, patient may discharge with clonidine only as needed.  We will have him follow-up with PCP in a week to recheck blood pressure  Problems Addressed: Dizziness: acute illness or injury Hypertension, unspecified type: acute illness or injury  Amount and/or Complexity of Data Reviewed External Data Reviewed: notes. Labs: ordered. Decision-making details documented in ED Course. Radiology: ordered and independent interpretation performed. Decision-making details documented in ED Course. ECG/medicine tests: ordered and independent interpretation performed. Decision-making details documented in ED Course.  Risk Prescription drug management.   Final Clinical Impression(s) / ED Diagnoses Final diagnoses:  Dizziness  Hypertension, unspecified type    Rx / DC Orders ED Discharge Orders     None         Charlynne Pander, MD 09/13/21 2312

## 2021-11-30 ENCOUNTER — Emergency Department (HOSPITAL_BASED_OUTPATIENT_CLINIC_OR_DEPARTMENT_OTHER)
Admission: EM | Admit: 2021-11-30 | Discharge: 2021-11-30 | Disposition: A | Discharge status: Home / Self Care | Payer: Self-pay | Attending: Emergency Medicine | Admitting: Emergency Medicine

## 2021-11-30 ENCOUNTER — Emergency Department (HOSPITAL_BASED_OUTPATIENT_CLINIC_OR_DEPARTMENT_OTHER): Payer: Self-pay

## 2021-11-30 ENCOUNTER — Other Ambulatory Visit: Payer: Self-pay

## 2021-11-30 ENCOUNTER — Encounter (HOSPITAL_BASED_OUTPATIENT_CLINIC_OR_DEPARTMENT_OTHER): Payer: Self-pay | Admitting: Emergency Medicine

## 2021-11-30 DIAGNOSIS — R072 Precordial pain: Secondary | ICD-10-CM | POA: Insufficient documentation

## 2021-11-30 DIAGNOSIS — R079 Chest pain, unspecified: Secondary | ICD-10-CM

## 2021-11-30 LAB — BASIC METABOLIC PANEL
Anion gap: 7 (ref 5–15)
BUN: 15 mg/dL (ref 6–20)
CO2: 27 mmol/L (ref 22–32)
Calcium: 9.2 mg/dL (ref 8.9–10.3)
Chloride: 104 mmol/L (ref 98–111)
Creatinine, Ser: 1.25 mg/dL — ABNORMAL HIGH (ref 0.61–1.24)
GFR, Estimated: >60 mL/min (ref >60)
Glucose, Bld: 154 mg/dL — ABNORMAL HIGH (ref 70–99)
Potassium: 3.9 mmol/L (ref 3.5–5.1)
Sodium: 138 mmol/L (ref 135–145)

## 2021-11-30 LAB — CBC WITH DIFFERENTIAL/PLATELET
Abs Immature Granulocytes: 0.02 10*3/uL (ref 0.00–0.07)
Basophils Absolute: 0 10*3/uL (ref 0.0–0.1)
Basophils Relative: 0 %
Eosinophils Absolute: 0.1 10*3/uL (ref 0.0–0.5)
Eosinophils Relative: 1 %
HCT: 45.3 % (ref 39.0–52.0)
Hemoglobin: 15.7 g/dL (ref 13.0–17.0)
Immature Granulocytes: 0 %
Lymphocytes Relative: 23 %
Lymphs Abs: 2 10*3/uL (ref 0.7–4.0)
MCH: 30.4 pg (ref 26.0–34.0)
MCHC: 34.7 g/dL (ref 30.0–36.0)
MCV: 87.8 fL (ref 80.0–100.0)
Monocytes Absolute: 0.4 10*3/uL (ref 0.1–1.0)
Monocytes Relative: 5 %
Neutro Abs: 6.4 10*3/uL (ref 1.7–7.7)
Neutrophils Relative %: 71 %
Platelets: 187 10*3/uL (ref 150–400)
RBC: 5.16 MIL/uL (ref 4.22–5.81)
RDW: 12.3 % (ref 11.5–15.5)
WBC: 8.9 10*3/uL (ref 4.0–10.5)
nRBC: 0 % (ref 0.0–0.2)

## 2021-11-30 LAB — TROPONIN I (HIGH SENSITIVITY): Troponin I (High Sensitivity): 5 ng/L (ref <18)

## 2021-11-30 MED ORDER — PANTOPRAZOLE SODIUM 20 MG PO TBEC: 20.0000 mg | DELAYED_RELEASE_TABLET | Freq: Every day | ORAL | 0 refills | Status: DC

## 2021-11-30 NOTE — ED Provider Notes (Signed)
?[facility] EMERGENCY DEPARTMENT ?Provider Note ? ? ?CSN: [id number] ?Arrival date & time: 11/30/21  1635 ? ?  ? ?History ? ?Chief Complaint  ?Patient presents with  ? Chest Pain  ? ? ?Tanveer Brammer is a 41 y.o. male. ? ?The history is provided by the patient.  ?Chest Pain ?Pain location:  Substernal area ?Pain quality: burning   ?Pain radiates to:  Does not radiate ?Pain severity:  Mild ?Onset quality:  Gradual ?Duration:  1 week ?Timing:  Intermittent ?Progression:  Waxing and waning ?Chronicity:  New ?Context: at rest   ?Relieved by:  Nothing ?Worsened by:  Nothing ?Associated symptoms: no abdominal pain, no altered mental status, no anorexia, no anxiety, no back pain, no claudication, no cough, no diaphoresis, no dizziness, no dysphagia, no fever, no headache, no numbness, no orthopnea, no palpitations, no PND, no shortness of breath, no syncope, no vomiting and no weakness   ?Risk factors: no coronary artery disease, no diabetes mellitus, no high cholesterol, no hypertension, no prior DVT/PE and no smoking   ? ?  ? ?Home Medications ?Prior to Admission medications   ?Medication Sig Start Date End Date Taking? Authorizing Provider  ?pantoprazole (PROTONIX) 20 MG tablet Take 1 tablet (20 mg total) by mouth daily. 11/30/21 12/30/21 Yes Kamren Heintzelman, DO  ?cholecalciferol (VITAMIN D) 1000 UNITS tablet Take 1,000 Units by mouth daily.    [provider]  ?cloNIDine (CATAPRES) 0.2 MG tablet Take 1 tablet (0.2 mg total) by mouth daily as needed (BP > 160). 09/13/21   Charlynne Pander, MD  ?ibuprofen (ADVIL,MOTRIN) 800 MG tablet Take 1 tablet (800 mg total) by mouth every 8 (eight) hours as needed. 04/27/14   Charlestine Night, PA-C  ?Omega-3 Fatty Acids (FISH OIL PO) Take 1 capsule by mouth daily.    [provider]  ?   ? ?Allergies    ?Patient has no known allergies.   ? ?Review of Systems   ?Review of Systems  ?Constitutional:  Negative for diaphoresis and fever.  ?HENT:  Negative for trouble  swallowing.   ?Respiratory:  Negative for cough and shortness of breath.   ?Cardiovascular:  Positive for chest pain. Negative for palpitations, orthopnea, claudication, syncope and PND.  ?Gastrointestinal:  Negative for abdominal pain, anorexia and vomiting.  ?Musculoskeletal:  Negative for back pain.  ?Neurological:  Negative for dizziness, weakness, numbness and headaches.  ? ?Physical Exam ?Updated Vital Signs ?BP 136/80   Pulse 63   Temp 99 ?F (37.2 ?C) (Oral)   Resp 15   Ht 5\' 9"  (1.753 m)   Wt 81.2 kg   SpO2 98%   BMI 26.43 kg/m?  ?Physical Exam ?Vitals and nursing note reviewed.  ?Constitutional:   ?   General: He is not in acute distress. ?   Appearance: He is well-developed. He is not ill-appearing.  ?HENT:  ?   Head: Normocephalic and atraumatic.  ?Eyes:  ?   Extraocular Movements: Extraocular movements intact.  ?   Conjunctiva/sclera: Conjunctivae normal.  ?   Pupils: Pupils are equal, round, and reactive to light.  ?Cardiovascular:  ?   Rate and Rhythm: Normal rate and regular rhythm.  ?   Pulses:     ?     Radial pulses are 2+ on the left side.  ?   Heart sounds: Normal heart sounds. No murmur heard. ?Pulmonary:  ?   Effort: Pulmonary effort is normal. No respiratory distress.  ?   Breath sounds: Normal breath sounds. No  decreased breath sounds.  ?Abdominal:  ?   Palpations: Abdomen is soft.  ?   Tenderness: There is no abdominal tenderness.  ?Musculoskeletal:     ?   General: No swelling. Normal range of motion.  ?   Cervical back: Normal range of motion and neck supple.  ?   Right lower leg: No edema.  ?   Left lower leg: No edema.  ?Skin: ?   General: Skin is warm and dry.  ?   Capillary Refill: Capillary refill takes less than 2 seconds.  ?Neurological:  ?   Mental Status: He is alert.  ?Psychiatric:     ?   Mood and Affect: Mood normal.  ? ? ?ED Results / Procedures / Treatments   ?Labs ?(all labs ordered are listed, but only abnormal results are displayed) ?Labs Reviewed  ?BASIC METABOLIC  PANEL - Abnormal; Notable for the following components:  ?    Result Value  ? Glucose, Bld 154 (*)   ? Creatinine, Ser 1.25 (*)   ? All other components within normal limits  ?CBC WITH DIFFERENTIAL/PLATELET  ?TROPONIN I (HIGH SENSITIVITY)  ? ? ?EKG ?EKG Interpretation ? ?Date/Time:  Thursday November 30 2021 16:45:05 EDT ?Ventricular Rate:  63 ?PR Interval:  158 ?QRS Duration: 106 ?QT Interval:  397 ?QTC Calculation: 407 ?R Axis:   60 ?Text Interpretation: Sinus rhythm Confirmed by Virgina Norfolkuratolo, Jachin Coury [phone]) on 11/30/2021 4:48:56 PM ? ?Radiology ?DG Chest Portable 1 View ? ?Result Date: 11/30/2021 ?CLINICAL DATA:  Chest pain. EXAM: PORTABLE CHEST 1 VIEW COMPARISON:  Chest x-ray 04/27/2014. FINDINGS: The heart size and mediastinal contours are within normal limits. Both lungs are clear. The visualized skeletal structures are unremarkable. IMPRESSION: No active disease. Electronically Signed   By: Darliss CheneyAmy  Guttmann M.D.   On: 11/30/2021 17:01   ? ?Procedures ?Procedures  ? ? ?Medications Ordered in ED ?Medications - No data to display ? ?ED Course/ Medical Decision Making/ A&P ?  ?                        ?Medical Decision Making ?Amount and/or Complexity of Data Reviewed ?Labs: ordered. ?Radiology: ordered. ? ?Risk ?Prescription drug management. ? ? 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  Overall unremarkable vitals.  No fever.  Intermittent chest pain that he describes as burning for the last 7 days.  Denies any shortness of breath, cough, sputum production.  Heart score is 1.  No significant medical history.  Does not take any medications.  No smoking history.  Denies any infectious symptoms.  No abdominal pain.  Not having any current chest pain now.  Was nervous because blood pressure was slightly high today and is in the 150s.  He states that he checks his blood pressure every day and most of the time his blood pressure is well below 140 systolic.  Overall differential diagnosis includes acid reflux versus MSK pain versus less  likely ACS.  PERC negative and doubt PE.  Unlikely pneumonia or infectious process.  Exam is overall unremarkable.  Clear breath sounds.  EKG per my review and interpretation shows sinus rhythm.  No ischemic changes.  Fairly unchanged from prior EKGs.  Will check troponin, CBC, BMP.  Will obtain chest x-ray. ? ?Per my review and interpretation of chest x-ray there is no pneumonia or pneumothorax.  Per my review interpretation of labs there is no significant anemia, electrolyte abnormality, kidney injury.  Troponin is normal.  Overall atypical story.  Could be reflux.  Could be anxiety.  We will start him on Protonix.  We will have him follow-up with primary care doctor.  Discharged in good condition. ? ?This chart was dictated using voice recognition software.  Despite best efforts to proofread,  errors can occur which can change the documentation meaning.  ? ? ? ? ? ? ? ?Final Clinical Impression(s) / ED Diagnoses ?Final diagnoses:  ?Nonspecific chest pain  ? ? ?Rx / DC Orders ?ED Discharge Orders   ? ?      Ordered  ?  pantoprazole (PROTONIX) 20 MG tablet  Daily       ? 11/30/21 1738  ? ?  ?  ? ?  ? ? ?  ?Virgina Norfolk, DO ?11/30/21 1739 ? ?

## 2021-11-30 NOTE — ED Triage Notes (Signed)
Pt reports central chest pain "for a couple of days." Pt also reports dizziness.  ?

## 2021-12-26 DIAGNOSIS — I10 Essential (primary) hypertension: Secondary | ICD-10-CM | POA: Insufficient documentation

## 2022-01-18 ENCOUNTER — Emergency Department (HOSPITAL_BASED_OUTPATIENT_CLINIC_OR_DEPARTMENT_OTHER): Payer: BLUE CROSS/BLUE SHIELD

## 2022-01-18 ENCOUNTER — Emergency Department (HOSPITAL_BASED_OUTPATIENT_CLINIC_OR_DEPARTMENT_OTHER)
Admission: EM | Admit: 2022-01-18 | Discharge: 2022-01-18 | Disposition: A | Discharge status: Home / Self Care | Payer: BLUE CROSS/BLUE SHIELD | Attending: Emergency Medicine | Admitting: Emergency Medicine

## 2022-01-18 ENCOUNTER — Other Ambulatory Visit: Payer: Self-pay

## 2022-01-18 ENCOUNTER — Encounter (HOSPITAL_BASED_OUTPATIENT_CLINIC_OR_DEPARTMENT_OTHER): Payer: Self-pay

## 2022-01-18 DIAGNOSIS — Z79899 Other long term (current) drug therapy: Secondary | ICD-10-CM | POA: Insufficient documentation

## 2022-01-18 DIAGNOSIS — I10 Essential (primary) hypertension: Secondary | ICD-10-CM | POA: Diagnosis not present

## 2022-01-18 DIAGNOSIS — R079 Chest pain, unspecified: Secondary | ICD-10-CM | POA: Diagnosis present

## 2022-01-18 DIAGNOSIS — R0789 Other chest pain: Secondary | ICD-10-CM | POA: Insufficient documentation

## 2022-01-18 LAB — BASIC METABOLIC PANEL
Anion gap: 7 (ref 5–15)
BUN: 14 mg/dL (ref 6–20)
CO2: 26 mmol/L (ref 22–32)
Calcium: 9 mg/dL (ref 8.9–10.3)
Chloride: 104 mmol/L (ref 98–111)
Creatinine, Ser: 1.02 mg/dL (ref 0.61–1.24)
GFR, Estimated: >60 mL/min (ref >60)
Glucose, Bld: 96 mg/dL (ref 70–99)
Potassium: 4 mmol/L (ref 3.5–5.1)
Sodium: 137 mmol/L (ref 135–145)

## 2022-01-18 LAB — CBC
HCT: 44.8 % (ref 39.0–52.0)
Hemoglobin: 15.1 g/dL (ref 13.0–17.0)
MCH: 30.1 pg (ref 26.0–34.0)
MCHC: 33.7 g/dL (ref 30.0–36.0)
MCV: 89.2 fL (ref 80.0–100.0)
Platelets: 174 10*3/uL (ref 150–400)
RBC: 5.02 MIL/uL (ref 4.22–5.81)
RDW: 12.4 % (ref 11.5–15.5)
WBC: 7.7 10*3/uL (ref 4.0–10.5)
nRBC: 0 % (ref 0.0–0.2)

## 2022-01-18 LAB — TROPONIN I (HIGH SENSITIVITY)
Troponin I (High Sensitivity): 4 ng/L (ref <18)
Troponin I (High Sensitivity): 5 ng/L (ref <18)

## 2022-01-18 MED ORDER — NAPROXEN 375 MG PO TABS
375.0000 mg | ORAL_TABLET | Freq: Two times a day (BID) | ORAL | 0 refills | Status: AC
Start: 2022-01-18 — End: ?

## 2022-01-18 NOTE — ED Notes (Signed)
Patient transported to X-ray 

## 2022-01-18 NOTE — Discharge Instructions (Addendum)
Your work-up today was reassuring.  I have sent naproxen into the pharmacy for you.  Follow-up with your PCP.  If you have any worsening symptoms return to the emergency room.

## 2022-01-18 NOTE — ED Triage Notes (Signed)
States was woken up at 0100 with left sided chest pain. States has had pain since as well as pain in left shoulder and arm.

## 2022-01-18 NOTE — ED Provider Notes (Signed)
[facility] EMERGENCY DEPARTMENT Provider Note   CSN: [id number] Arrival date & time: 01/18/22  1158     History  Chief Complaint  Patient presents with   Chest Pain    Cody Lin is a 41 y.o. male.  41 year old male with past medical history significant for hypertension, and anxiety presents today for evaluation of left-sided chest pain, that radiated to his left shoulder since last night.  Patient reports he was woken up by this pain at a 100.  Took him a few hours to go back to sleep after improvement of his pain.  He states that his pain has been constant since onset.  He noticed pain radiated to his left shoulder after waking up this morning.  Currently denies pain in his left shoulder or left arm.  He denies shortness of breath, lightheadedness, palpitations, or diaphoresis.  No prior cardiac cardiac history with the exception of hypertension.  Denies significant cardiac family history.  He states pain is worse with certain motions, particularly moving his left arm, or bending over.  He states while he is at rest currently he does not feel any pain.  Pain is not worse with deep inspiration.  Denies URI symptoms.  The history is provided by the patient. No language interpreter was used.      Home Medications Prior to Admission medications   Medication Sig Start Date End Date Taking? Authorizing Provider  losartan (COZAAR) 25 MG tablet Take 25 mg by mouth daily. 12/21/21  Yes [provider]  pantoprazole (PROTONIX) 20 MG tablet Take 1 tablet by mouth daily. 11/30/21  Yes [provider]  cholecalciferol (VITAMIN D) 1000 UNITS tablet Take 1,000 Units by mouth daily.    [provider]  cloNIDine (CATAPRES) 0.2 MG tablet Take 1 tablet (0.2 mg total) by mouth daily as needed (BP > 160). 09/13/21   Charlynne PanderYao, David Hsienta, MD  ibuprofen (ADVIL,MOTRIN) 800 MG tablet Take 1 tablet (800 mg total) by mouth every 8 (eight) hours as needed. 04/27/14   Lawyer,  Cristal Deerhristopher, PA-C  Omega-3 Fatty Acids (FISH OIL PO) Take 1 capsule by mouth daily.    [provider]  pantoprazole (PROTONIX) 20 MG tablet Take 1 tablet (20 mg total) by mouth daily. 11/30/21 12/30/21  Virgina Norfolkuratolo, Adam, DO      Allergies    Patient has no known allergies.    Review of Systems   Review of Systems  Constitutional:  Negative for chills and fever.  HENT:  Negative for congestion.   Respiratory:  Negative for cough and shortness of breath.   Cardiovascular:  Positive for chest pain. Negative for palpitations and leg swelling.  Gastrointestinal:  Negative for nausea and vomiting.  Neurological:  Negative for weakness and light-headedness.  All other systems reviewed and are negative.  Physical Exam Updated Vital Signs BP (!) 141/74 (BP Location: Right Arm)   Pulse 61   Temp 98.2 F (36.8 C) (Oral)   Resp 20   Ht 5\' 9"  (1.753 m)   Wt 78.5 kg   SpO2 98%   BMI 25.55 kg/m  Physical Exam Vitals and nursing note reviewed.  Constitutional:      General: He is not in acute distress.    Appearance: Normal appearance. He is not ill-appearing.  HENT:     Head: Normocephalic and atraumatic.     Nose: Nose normal.  Eyes:     General: No scleral icterus.    Extraocular Movements: Extraocular movements intact.  Conjunctiva/sclera: Conjunctivae normal.  Cardiovascular:     Rate and Rhythm: Normal rate and regular rhythm.     Pulses: Normal pulses.          Radial pulses are 2+ on the right side and 2+ on the left side.     Heart sounds: Normal heart sounds.     Comments: Symmetrical radial pulses bilaterally.  Blood pressures without significant various between bilateral upper extremities. Pulmonary:     Effort: Pulmonary effort is normal. No respiratory distress.     Breath sounds: Normal breath sounds. No wheezing or rales.  Abdominal:     General: There is no distension.     Tenderness: There is no abdominal tenderness.  Musculoskeletal:        General:  Normal range of motion.     Cervical back: Normal range of motion.  Skin:    General: Skin is warm and dry.  Neurological:     General: No focal deficit present.     Mental Status: He is alert. Mental status is at baseline.    ED Results / Procedures / Treatments   Labs (all labs ordered are listed, but only abnormal results are displayed) Labs Reviewed  BASIC METABOLIC PANEL  CBC  TROPONIN I (HIGH SENSITIVITY)    EKG None  Radiology DG Chest 2 View  Result Date: 01/18/2022 CLINICAL DATA:  Chest pain. EXAM: CHEST - 2 VIEW COMPARISON:  Chest radiograph November 30, 2021. FINDINGS: No consolidation. No visible pleural effusions or pneumothorax. Cardiomediastinal silhouette is within normal limits. No displaced fracture. IMPRESSION: No evidence of acute cardiopulmonary disease. Electronically Signed   By: Feliberto Harts M.D.   On: 01/18/2022 12:44    Procedures Procedures    Medications Ordered in ED Medications - No data to display  ED Course/ Medical Decision Making/ A&P                           Medical Decision Making Amount and/or Complexity of Data Reviewed Labs: ordered. Radiology: ordered.   Medical Decision Making / ED Course   This patient presents to the ED for concern of chest pain, this involves an extensive number of treatment options, and is a complaint that carries with it a high risk of complications and morbidity.  The differential diagnosis includes ACS, GERD, MSK pain, PE, pneumonia  MDM: 41 year old male with past medical history significant for hypertension, anxiety presents today for evaluation of left-sided chest pain that radiated into his left shoulder.  Denied other associated symptoms.  Reports compliance with his antihypertensive.  Currently denies chest pain.  However has some discomfort with certain range of motion.  Will evaluate with ACS labs. CBC without leukocytosis or anemia.  BMP unremarkable.  Troponin initially 4, repeat 5 without  significant delta.  Heart score of 1.  Unlikely to be ACS.  EKG without acute ischemic changes.  Chest x-ray without acute cardiopulmonary process.  Toradol provided and with some improvement.  Given the description of pain this is likely MSK in nature.  Will provide naproxen.  Discussed importance of follow-up with his PCP.  He voices understanding and is in agreement with plan.  Denies recent travel, leg swelling, leg pain.  Low risk for PE.  PERC negative.   Additional history obtained: -Additional history obtained from previous emergency room visit for similar complaints.  Work-up reassuring in the past as well. -External records from outside source obtained and reviewed including: Chart review  including previous notes, labs, imaging, consultation notes   Lab Tests: -I ordered, reviewed, and interpreted labs.   The pertinent results include:   Labs Reviewed  BASIC METABOLIC PANEL  CBC  TROPONIN I (HIGH SENSITIVITY)  TROPONIN I (HIGH SENSITIVITY)      EKG  EKG Interpretation  Date/Time:  Thursday Jan 18 2022 12:04:48 EDT Ventricular Rate:  63 PR Interval:  149 QRS Duration: 108 QT Interval:  390 QTC Calculation: 400 R Axis:   23 Text Interpretation: Sinus rhythm RSR' in V1 or V2, probably normal variant Confirmed by Marianna Fuss ([id number]) on 01/18/2022 1:03:52 PM         Imaging Studies ordered: I ordered imaging studies including chest x-ray I independently visualized and interpreted imaging. I agree with the radiologist interpretation   Medicines ordered and prescription drug management: No orders of the defined types were placed in this encounter.   -I have reviewed the patients home medicines and have made adjustments as needed  Reevaluation: After the interventions noted above, I reevaluated the patient and found that they have :improved  Co morbidities that complicate the patient evaluation  Past Medical History:  Diagnosis Date   Heart murmur     Hypertension       Dispostion: Patient is appropriate for discharge.  Discharged in stable condition.  Return precautions discussed.  Patient voices understanding and is in agreement with plan.  Final Clinical Impression(s) / ED Diagnoses Final diagnoses:  Atypical chest pain    Rx / DC Orders ED Discharge Orders          Ordered    naproxen (NAPROSYN) 375 MG tablet  2 times daily        01/18/22 1531              Marita Kansas, PA-C 01/18/22 1531    Milagros Loll, MD 01/20/22 2253

## 2022-06-12 DIAGNOSIS — K219 Gastro-esophageal reflux disease without esophagitis: Secondary | ICD-10-CM | POA: Insufficient documentation

## 2022-12-21 DIAGNOSIS — R7303 Prediabetes: Secondary | ICD-10-CM | POA: Insufficient documentation

## 2023-04-07 IMAGING — CR DG CHEST 2V
2 series · 2 of 2 positions shown · non-contrast
Comparison: Chest radiograph November 30, 2021.

CLINICAL DATA: Chest pain.

EXAM:
CHEST - 2 VIEW

[w chest pa]
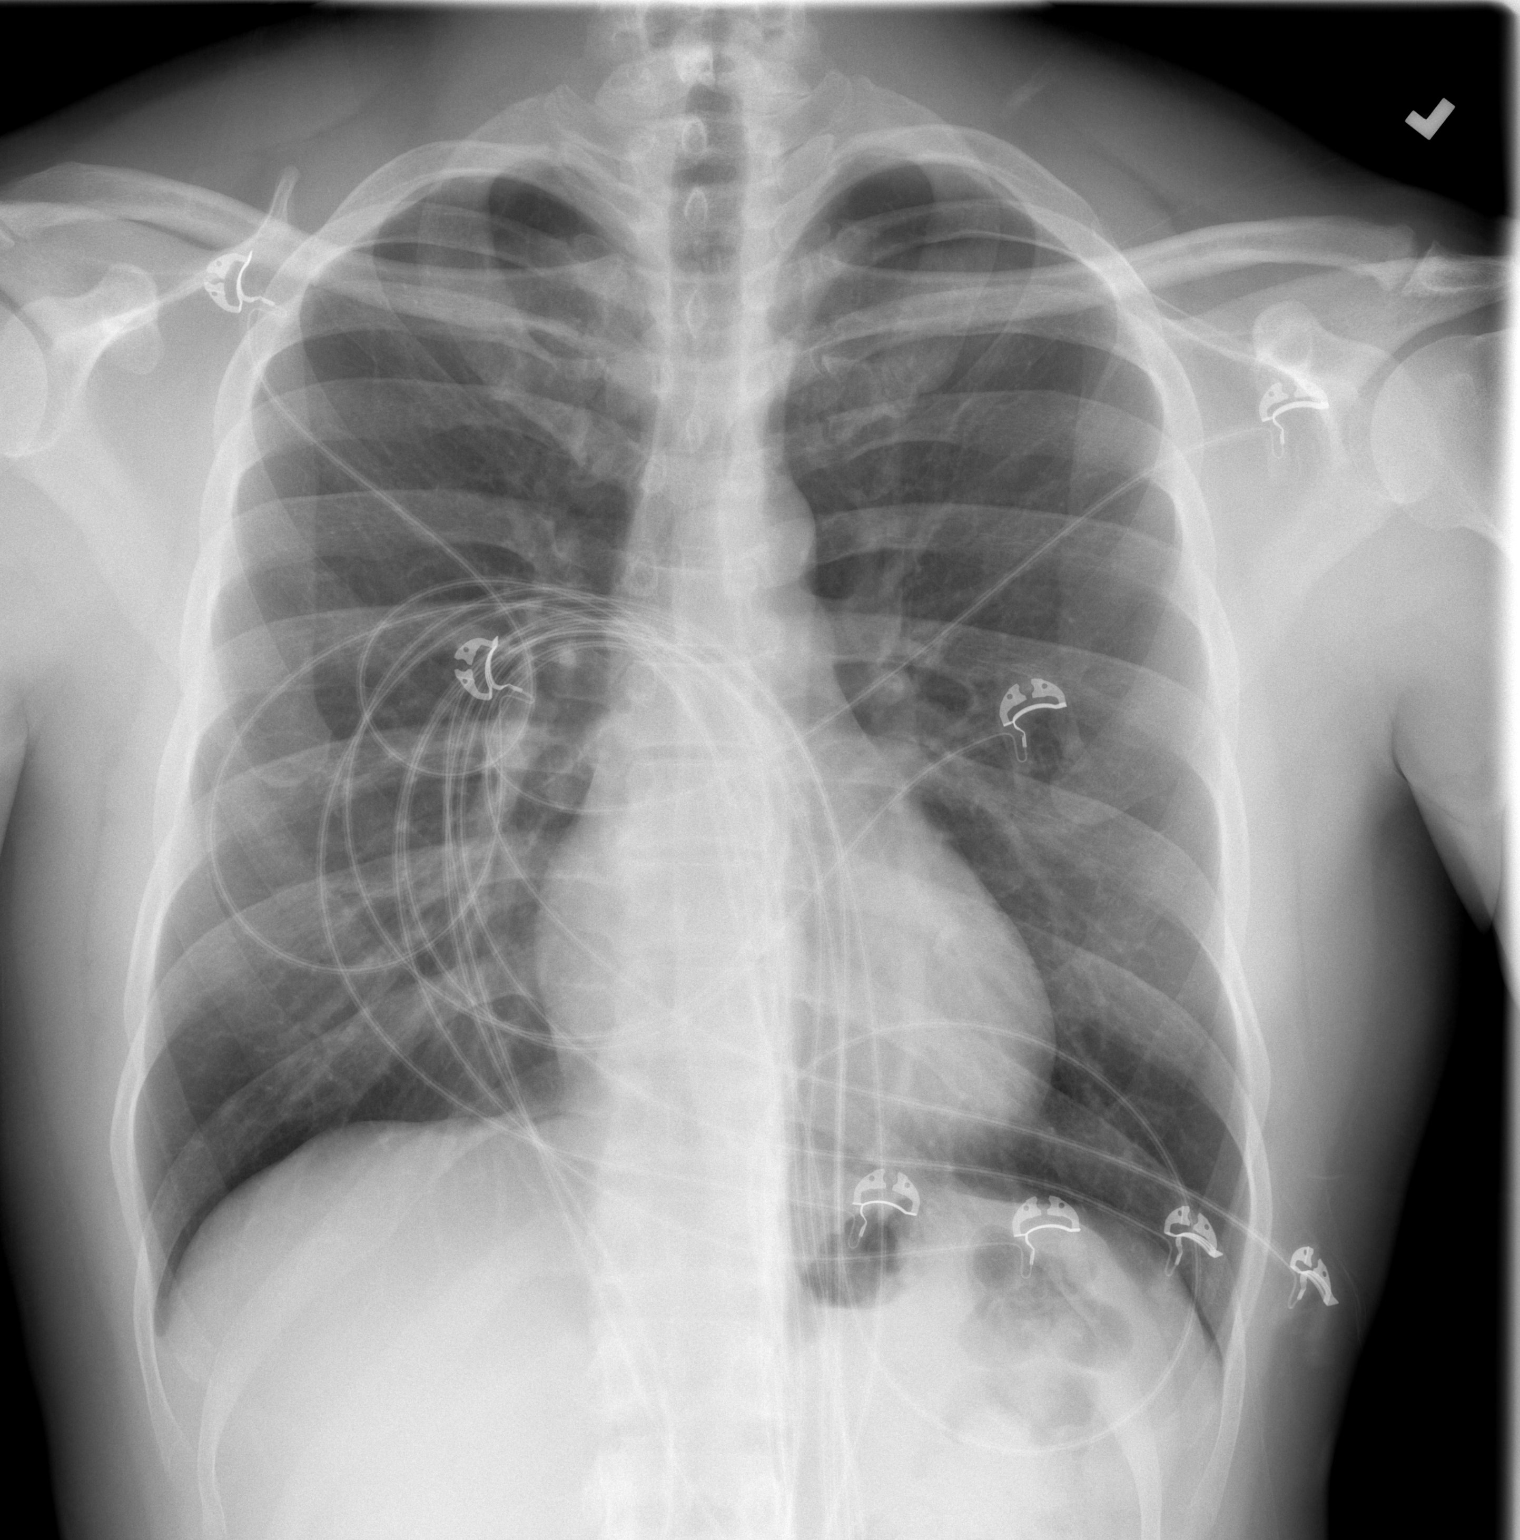

[w chest lat]
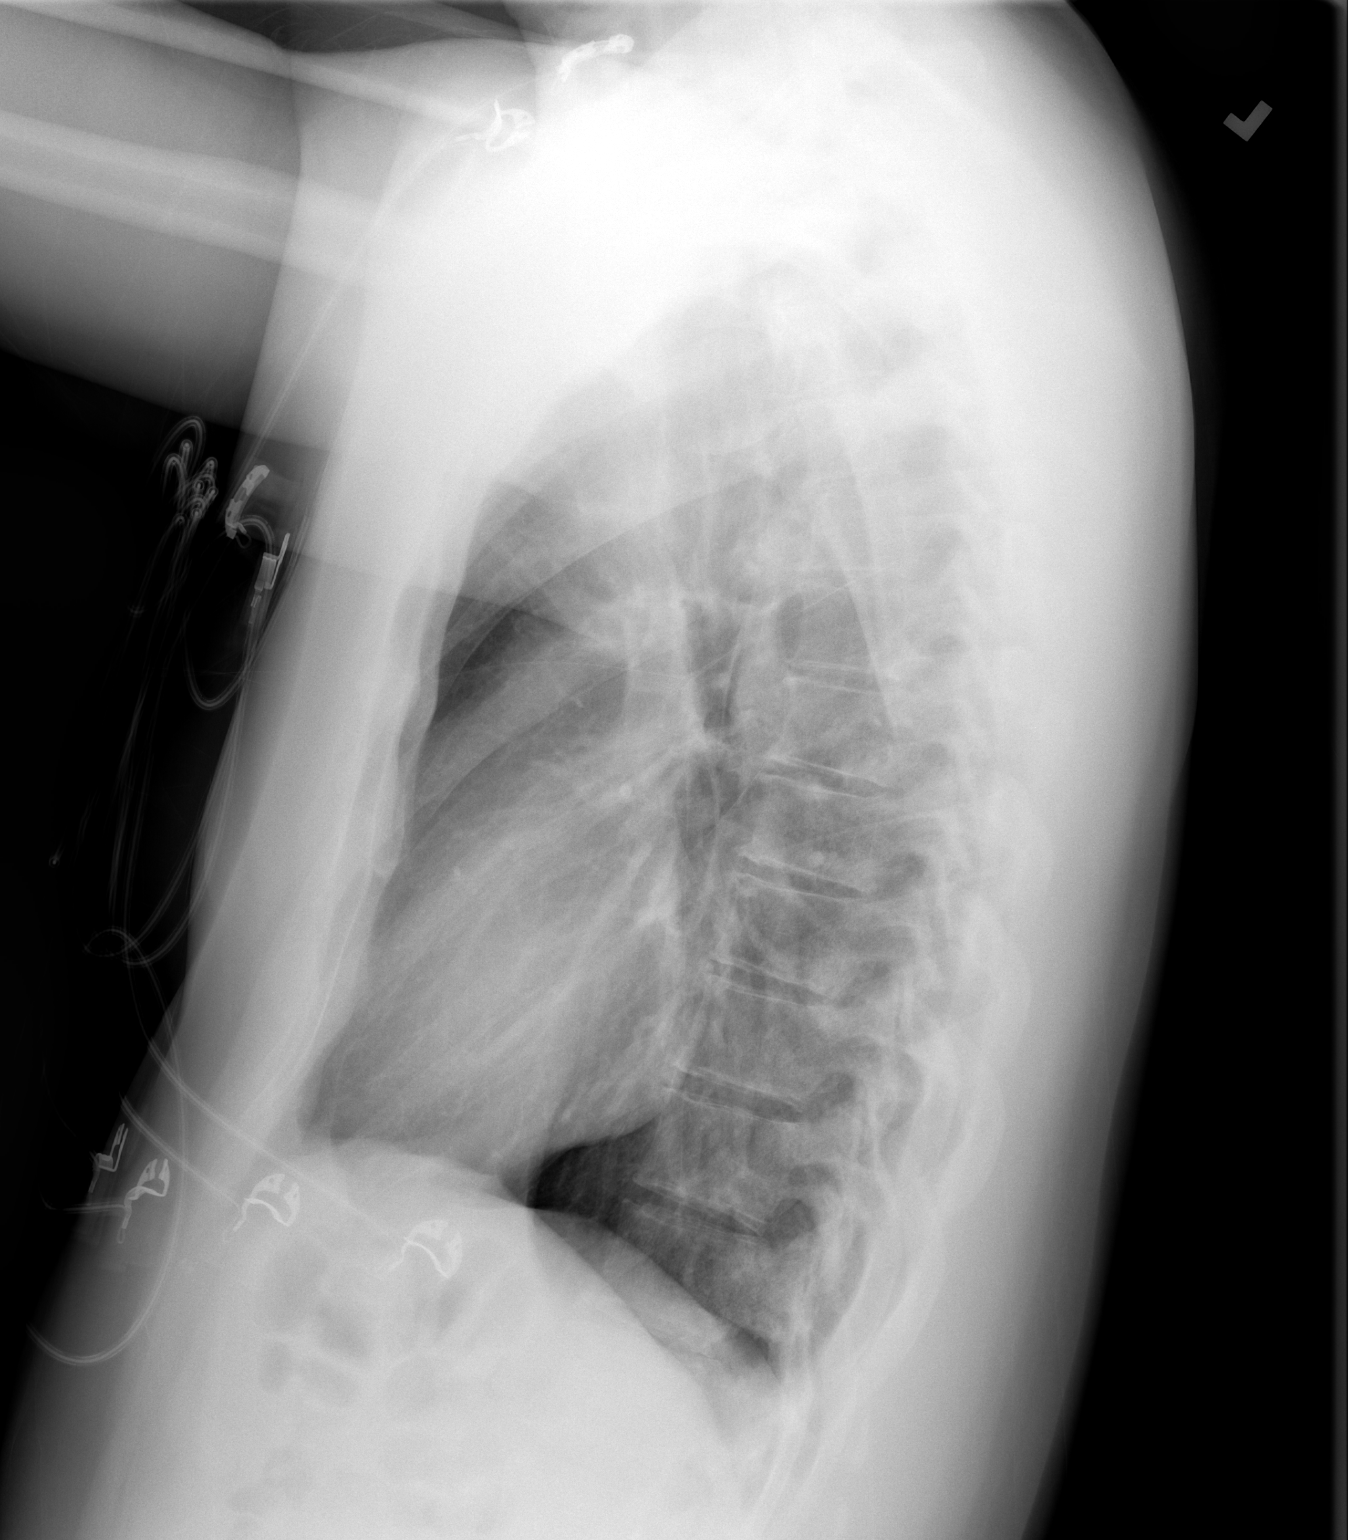

[2 of 2 positions shown; findings below may reference images not displayed]

FINDINGS: No consolidation. No visible pleural effusions or pneumothorax.
Cardiomediastinal silhouette is within normal limits. No displaced
fracture.
IMPRESSION: No evidence of acute cardiopulmonary disease.

## 2023-10-04 ENCOUNTER — Emergency Department (HOSPITAL_BASED_OUTPATIENT_CLINIC_OR_DEPARTMENT_OTHER)
Admission: EM | Admit: 2023-10-04 | Discharge: 2023-10-04 | Disposition: A | Discharge status: Home / Self Care | Payer: Self-pay | Attending: Emergency Medicine | Admitting: Emergency Medicine

## 2023-10-04 ENCOUNTER — Emergency Department (HOSPITAL_BASED_OUTPATIENT_CLINIC_OR_DEPARTMENT_OTHER): Payer: Self-pay

## 2023-10-04 ENCOUNTER — Encounter (HOSPITAL_BASED_OUTPATIENT_CLINIC_OR_DEPARTMENT_OTHER): Payer: Self-pay

## 2023-10-04 DIAGNOSIS — K529 Noninfective gastroenteritis and colitis, unspecified: Secondary | ICD-10-CM | POA: Insufficient documentation

## 2023-10-04 DIAGNOSIS — R0789 Other chest pain: Secondary | ICD-10-CM

## 2023-10-04 DIAGNOSIS — I10 Essential (primary) hypertension: Secondary | ICD-10-CM | POA: Insufficient documentation

## 2023-10-04 DIAGNOSIS — Z79899 Other long term (current) drug therapy: Secondary | ICD-10-CM | POA: Insufficient documentation

## 2023-10-04 DIAGNOSIS — K219 Gastro-esophageal reflux disease without esophagitis: Secondary | ICD-10-CM

## 2023-10-04 LAB — COMPREHENSIVE METABOLIC PANEL
ALT: 21 U/L (ref 0–44)
AST: 26 U/L (ref 15–41)
Albumin: 4.5 g/dL (ref 3.5–5.0)
Alkaline Phosphatase: 44 U/L (ref 38–126)
Anion gap: 7 (ref 5–15)
BUN: 19 mg/dL (ref 6–20)
CO2: 27 mmol/L (ref 22–32)
Calcium: 9.6 mg/dL (ref 8.9–10.3)
Chloride: 102 mmol/L (ref 98–111)
Creatinine, Ser: 1.13 mg/dL (ref 0.61–1.24)
GFR, Estimated: >60 mL/min (ref >60)
Glucose, Bld: 103 mg/dL — ABNORMAL HIGH (ref 70–99)
Potassium: 3.9 mmol/L (ref 3.5–5.1)
Sodium: 136 mmol/L (ref 135–145)
Total Bilirubin: 1.6 mg/dL — ABNORMAL HIGH (ref 0.0–1.2)
Total Protein: 7.8 g/dL (ref 6.5–8.1)

## 2023-10-04 LAB — TROPONIN I (HIGH SENSITIVITY)
Troponin I (High Sensitivity): 6 ng/L (ref <18)
Troponin I (High Sensitivity): 6 ng/L (ref <18)

## 2023-10-04 LAB — CBC
HCT: 46.4 % (ref 39.0–52.0)
Hemoglobin: 15.8 g/dL (ref 13.0–17.0)
MCH: 30.2 pg (ref 26.0–34.0)
MCHC: 34.1 g/dL (ref 30.0–36.0)
MCV: 88.5 fL (ref 80.0–100.0)
Platelets: 168 10*3/uL (ref 150–400)
RBC: 5.24 MIL/uL (ref 4.22–5.81)
RDW: 12.4 % (ref 11.5–15.5)
WBC: 6.6 10*3/uL (ref 4.0–10.5)
nRBC: 0 % (ref 0.0–0.2)

## 2023-10-04 MED ORDER — FAMOTIDINE 20 MG PO TABS: 20.0000 mg | ORAL_TABLET | Freq: Two times a day (BID) | ORAL | 0 refills | Status: AC

## 2023-10-04 MED ORDER — LIDOCAINE VISCOUS HCL 2 % MT SOLN
15.0000 mL | Freq: Once | OROMUCOSAL | Status: AC
  Administered 2023-10-04: 15 mL via ORAL
  Filled 2023-10-04: qty 15

## 2023-10-04 MED ORDER — ALUM & MAG HYDROXIDE-SIMETH 200-200-20 MG/5ML PO SUSP
30.0000 mL | Freq: Once | ORAL | Status: AC
  Administered 2023-10-04: 30 mL via ORAL
  Filled 2023-10-04: qty 30

## 2023-10-04 NOTE — ED Triage Notes (Signed)
 Chest pain x 2 days with feeling of pressure in center of chest. Denies cardiac hx. States that he has had some shortness of breath at rest

## 2023-10-04 NOTE — Discharge Instructions (Addendum)
 Thank you for coming to [facility] Emergency Department. You were seen for chest pain. We did an exam, labs, and imaging, and these showed no acute findings.  It is possible that your symptoms are due to reflux or GERD.  Please take Pepcid  20 mg twice per day for 30 days.  You can also take Tylenol for pain.  Please avoid alcohol, caffeine, tobacco, chocolate, fatty and spicy foods, eating late at night, and NSAIDs like ibuprofen  or Aleve . Please follow up with your primary care provider within 1 week.   Do not hesitate to return to the ED or call 911 if you experience: -Worsening symptoms -Vomiting, sweating -Numbness/tingling -Lightheadedness, passing out -Fevers/chills -Anything else that concerns you

## 2023-10-04 NOTE — ED Provider Notes (Signed)
 [place] [facility] Provider Note   CSN: [id number] Arrival date & time: 10/04/23  9182     History  Chief Complaint  Patient presents with   Chest Pain    Cody Lin is a 43 y.o. male with GERD, HTN, pre-diabetes who presents with CP. Patient had pressure-like possibly reflux-like central chest pain yesterday that has been waxing waning, a/w nausea and mild SOB. Radiated to his back. Currently rated 2/10. Was not exerting himself when it got worse, which was 5/10. Also has had some heart-racing sensation. No associated vomiting/diaphoresis, numbness/tingling, cough, flu-like symptoms, abdominal pain. Denies EtOH, tobacco, or illicit drug use. No h/o heart trouble. No h/o DVT/PE. Drove to [place] about one month ago but hasn't had any leg swelling, recent surgeries/hospitalizations.   Past Medical History:  Diagnosis Date   Heart murmur    Hypertension        Home Medications Prior to Admission medications   Medication Sig Start Date End Date Taking? Authorizing Provider  famotidine  (PEPCID ) 20 MG tablet Take 1 tablet (20 mg total) by mouth 2 (two) times daily. 10/04/23  Yes Franklyn Sid SAILOR, MD  cholecalciferol (VITAMIN D) 1000 UNITS tablet Take 1,000 Units by mouth daily.    [provider]  cloNIDine  (CATAPRES ) 0.2 MG tablet Take 1 tablet (0.2 mg total) by mouth daily as needed (BP > 160). 09/13/21   Patt Alm Macho, MD  ibuprofen  (ADVIL ,MOTRIN ) 800 MG tablet Take 1 tablet (800 mg total) by mouth every 8 (eight) hours as needed. 04/27/14   Lawyer, Lonni, PA-C  losartan (COZAAR) 25 MG tablet Take 25 mg by mouth daily. 12/21/21   [provider]  naproxen  (NAPROSYN ) 375 MG tablet Take 1 tablet (375 mg total) by mouth 2 (two) times daily. 01/18/22   Hildegard Loge, PA-C  Omega-3 Fatty Acids (FISH OIL PO) Take 1 capsule by mouth daily.    [provider]      Allergies    Patient has no known allergies.    Review of  Systems   Review of Systems A 10 point review of systems was performed and is negative unless otherwise reported in HPI.  Physical Exam Updated Vital Signs BP 128/86   Pulse 63   Temp 98.2 F (36.8 C) (Oral)   Resp (!) 21   Ht 5' 9 (1.753 m)   Wt 78.5 kg   SpO2 100%   BMI 25.56 kg/m  Physical Exam General: Normal appearing male, lying in bed.  HEENT: PERRLA, Sclera anicteric, MMM, trachea midline.  Cardiology: RRR, no murmurs/rubs/gallops. BL radial and DP pulses equal bilaterally.  Resp: Normal respiratory rate and effort. CTAB, no wheezes, rhonchi, crackles.  Abd: Soft, non-tender, non-distended. No rebound tenderness or guarding.  GU: Deferred. MSK: No peripheral edema or signs of trauma. Extremities without deformity or TTP. No cyanosis or clubbing. Skin: warm, dry. No rashes or lesions. Back: No CVA tenderness Neuro: A&Ox4, CNs II-XII grossly intact. MAEs. Sensation grossly intact.  Psych: Normal mood and affect.   ED Results / Procedures / Treatments   Labs (all labs ordered are listed, but only abnormal results are displayed) Labs Reviewed  COMPREHENSIVE METABOLIC PANEL - Abnormal; Notable for the following components:      Result Value   Glucose, Bld 103 (*)    Total Bilirubin 1.6 (*)    All other components within normal limits  CBC  TROPONIN I (HIGH SENSITIVITY)  TROPONIN I (HIGH SENSITIVITY)  EKG EKG Interpretation Date/Time:  Friday October 04 2023 08:25:05 EST Ventricular Rate:  67 PR Interval:  153 QRS Duration:  109 QT Interval:  387 QTC Calculation: 409 R Axis:   49  Text Interpretation: Sinus rhythm RSR' in V1 or V2, probably normal variant SImilar to prior EKG Confirmed by Franklyn Gills [phone]) on 10/04/2023 8:31:59 AM  Radiology DG Chest Portable 1 View Result Date: 10/04/2023 CLINICAL DATA:  2 day history of chest pain EXAM: PORTABLE CHEST 1 VIEW COMPARISON:  Chest radiograph dated 01/18/2022 FINDINGS: Normal lung volumes. No focal  consolidations. No pleural effusion or pneumothorax. The heart size and mediastinal contours are within normal limits. No acute osseous abnormality. IMPRESSION: No acute disease. Electronically Signed   By: Limin  Xu M.D.   On: 10/04/2023 08:49    Procedures Procedures    Medications Ordered in ED Medications  alum & mag hydroxide-simeth (MAALOX/MYLANTA) 200-200-20 MG/5ML suspension 30 mL (30 mLs Oral Given 10/04/23 0937)    And  lidocaine  (XYLOCAINE ) 2 % viscous mouth solution 15 mL (15 mLs Oral Given 10/04/23 9062)    ED Course/ Medical Decision Making/ A&P                          Medical Decision Making Amount and/or Complexity of Data Reviewed Labs: ordered. Decision-making details documented in ED Course. Radiology: ordered. Decision-making details documented in ED Course.  Risk OTC drugs. Prescription drug management.    This patient presents to the ED for concern of CP, this involves an extensive number of treatment options, and is a complaint that carries with it a high risk of complications and morbidity.  I considered the following differential and admission for this acute, potentially life threatening condition.   MDM:    DDX for chest pain includes but is not limited to:  Very low suspicion for ACS  given presenting sx. Patient is able to [facility] out for PE, and no signs/sxs of DVT on exam. No abdominal pain and no c/f biliary disease. Consider GERD, given known history. CXR clear, no flu-like symptoms or cough. Consider arrhythmia given report of heart racing, but EKG here is NSR without arrhythmia. Lower c/f dissection, with extremely well-appearing patient, equal pulses in all extremities, no hyper/hypotension here in ED, no neurologic sxs, no widened mediastinum on chest x-ray, no h/o drug use. Does have h/o HTN but appears well-controlled. Will get labs inc trops and give GI cocktail and reassess.     Clinical Course as of 10/04/23 1127  Fri Oct 04, 2023  0917  Troponin I (High Sensitivity): 6 neg [HN]  [phone] DG Chest Portable 1 View No acute disease. [HN]  [id number] Comprehensive metabolic panel(!) Unremarkable in the context of this patient's presentation  [HN]  0918 CBC neg [HN]  1038 HEART score is 3 [HN]  1106 Troponin I (High Sensitivity): 6 Neg x2 [HN]  1126 Patient feeling improved after GI cocktail.  His heart score is low and he is very well-appearing.  Patient is advised to follow-up with his primary care physician within 1 week for his atypical chest pain and also possible GERD.  He does have Protonix  listed on his home meds but he reports he is not taking it and has not taken it in a long time.  Discussed lifestyle changes for GERD as well.  Given discharge instructions and return precautions, all questions answered to patient satisfaction. [HN]    Clinical Course User Index [HN] Franklyn Gills  N, MD    Labs: I Ordered, and personally interpreted labs.  The pertinent results include:  those listed above  Imaging Studies ordered: I ordered imaging studies including CXR I independently visualized and interpreted imaging. I agree with the radiologist interpretation  Additional history obtained from chart review.    Cardiac Monitoring: The patient was maintained on a cardiac monitor.  I personally viewed and interpreted the cardiac monitored which showed an underlying rhythm of: NSR  Reevaluation: After the interventions noted above, I reevaluated the patient and found that they have :improved  Social Determinants of Health: Lives independently  Disposition:  DC w/ discharge instructions/return precautions. All questions answered to patient's satisfaction.    Co morbidities that complicate the patient evaluation  Past Medical History:  Diagnosis Date   Heart murmur    Hypertension      Medicines Meds ordered this encounter  Medications   AND Linked Order Group    alum & mag hydroxide-simeth (MAALOX/MYLANTA) 200-200-20  MG/5ML suspension 30 mL    lidocaine  (XYLOCAINE ) 2 % viscous mouth solution 15 mL   famotidine  (PEPCID ) 20 MG tablet    Sig: Take 1 tablet (20 mg total) by mouth 2 (two) times daily.    Dispense:  30 tablet    Refill:  0    I have reviewed the patients home medicines and have made adjustments as needed  Problem List / ED Course: Problem List Items Addressed This Visit       Digestive   GERD (gastroesophageal reflux disease)   Relevant Medications   famotidine  (PEPCID ) 20 MG tablet   Other Visit Diagnoses       Atypical chest pain    -  Primary                   This note was created using dictation software, which may contain spelling or grammatical errors.    Franklyn Sid SAILOR, MD 10/04/23 [phone]

## 2024-09-04 ENCOUNTER — Other Ambulatory Visit: Payer: Self-pay

## 2024-09-04 ENCOUNTER — Emergency Department (HOSPITAL_BASED_OUTPATIENT_CLINIC_OR_DEPARTMENT_OTHER)
Admission: EM | Admit: 2024-09-04 | Discharge: 2024-09-04 | Disposition: A | Discharge status: Home / Self Care | Payer: Self-pay | Attending: Emergency Medicine | Admitting: Emergency Medicine

## 2024-09-04 ENCOUNTER — Encounter (HOSPITAL_BASED_OUTPATIENT_CLINIC_OR_DEPARTMENT_OTHER): Payer: Self-pay

## 2024-09-04 ENCOUNTER — Emergency Department (HOSPITAL_BASED_OUTPATIENT_CLINIC_OR_DEPARTMENT_OTHER): Payer: Self-pay

## 2024-09-04 DIAGNOSIS — R0789 Other chest pain: Secondary | ICD-10-CM | POA: Insufficient documentation

## 2024-09-04 DIAGNOSIS — I10 Essential (primary) hypertension: Secondary | ICD-10-CM | POA: Insufficient documentation

## 2024-09-04 DIAGNOSIS — Z79899 Other long term (current) drug therapy: Secondary | ICD-10-CM | POA: Insufficient documentation

## 2024-09-04 LAB — CBC
HCT: 43.1 % (ref 39.0–52.0)
Hemoglobin: 14.3 g/dL (ref 13.0–17.0)
MCH: 29.9 pg (ref 26.0–34.0)
MCHC: 33.2 g/dL (ref 30.0–36.0)
MCV: 90 fL (ref 80.0–100.0)
Platelets: 203 K/uL (ref 150–400)
RBC: 4.79 MIL/uL (ref 4.22–5.81)
RDW: 12.2 % (ref 11.5–15.5)
WBC: 7.7 K/uL (ref 4.0–10.5)
nRBC: 0 % (ref 0.0–0.2)

## 2024-09-04 LAB — COMPREHENSIVE METABOLIC PANEL WITH GFR
ALT: 18 U/L (ref 0–44)
AST: 23 U/L (ref 15–41)
Albumin: 4.6 g/dL (ref 3.5–5.0)
Alkaline Phosphatase: 52 U/L (ref 38–126)
Anion gap: 11 (ref 5–15)
BUN: 18 mg/dL (ref 6–20)
CO2: 26 mmol/L (ref 22–32)
Calcium: 9.3 mg/dL (ref 8.9–10.3)
Chloride: 105 mmol/L (ref 98–111)
Creatinine, Ser: 1.04 mg/dL (ref 0.61–1.24)
GFR, Estimated: >60 mL/min
Glucose, Bld: 94 mg/dL (ref 70–99)
Potassium: 4.3 mmol/L (ref 3.5–5.1)
Sodium: 142 mmol/L (ref 135–145)
Total Bilirubin: 0.4 mg/dL (ref 0.0–1.2)
Total Protein: 7.1 g/dL (ref 6.5–8.1)

## 2024-09-04 LAB — TROPONIN T, HIGH SENSITIVITY: Troponin T High Sensitivity: <15 ng/L (ref 0–19)

## 2024-09-04 LAB — D-DIMER, QUANTITATIVE: D-Dimer, Quant: 0.36 ug{FEU}/mL (ref 0.00–0.50)

## 2024-09-04 NOTE — ED Provider Notes (Signed)
 " [place] [facility] Provider Note   CSN: [id number] Arrival date & time: 09/04/24  2010     History  Chief Complaint  Patient presents with   Chest Pain    Cody Lin is a 44 y.o. male with PMH as listed below who presents with chest pain radiating to shoulder blades since 4pm.  Intermittent.  He was just standing when it started, no exertional symptoms.  No nausea vomiting.  No SOB, cough, flu-like sxs.  Last week he and his family drove to [place].  He denies any leg swelling, history of DVT or PE, shortness of breath, hemoptysis, recent immobilization/surgery/hospitalizations.  Has history of hypertension but does not take any medications.  No history of MI.  No history of alcohol or drug use.  Not currently having any pain.   Past Medical History:  Diagnosis Date   Heart murmur    Hypertension        Home Medications Prior to Admission medications  Medication Sig Start Date End Date Taking? Authorizing Provider  cholecalciferol (VITAMIN D) 1000 UNITS tablet Take 1,000 Units by mouth daily.    [provider]  cloNIDine  (CATAPRES ) 0.2 MG tablet Take 1 tablet (0.2 mg total) by mouth daily as needed (BP > 160). 09/13/21   Patt Alm Macho, MD  famotidine  (PEPCID ) 20 MG tablet Take 1 tablet (20 mg total) by mouth 2 (two) times daily. 10/04/23   Franklyn Sid SAILOR, MD  ibuprofen  (ADVIL ,MOTRIN ) 800 MG tablet Take 1 tablet (800 mg total) by mouth every 8 (eight) hours as needed. 04/27/14   Lawyer, Lonni, PA-C  losartan (COZAAR) 25 MG tablet Take 25 mg by mouth daily. 12/21/21   [provider]  naproxen  (NAPROSYN ) 375 MG tablet Take 1 tablet (375 mg total) by mouth 2 (two) times daily. 01/18/22   Hildegard Loge, PA-C  Omega-3 Fatty Acids (FISH OIL PO) Take 1 capsule by mouth daily.    [provider]      Allergies    Patient has no known allergies.    Review of Systems   Review of Systems A 10 point review of  systems was performed and is negative unless otherwise reported in HPI.  Physical Exam Updated Vital Signs BP 133/79 (BP Location: Right Arm)   Pulse (!) 55   Temp 99 F (37.2 C) (Oral)   Resp 16   Ht 5' 9 (1.753 m)   Wt 81.6 kg   SpO2 99%   BMI 26.58 kg/m  Physical Exam General: Normal appearing male, lying in bed.  HEENT: PERRLA, Sclera anicteric, MMM, trachea midline.  Cardiology: RRR, no murmurs/rubs/gallops. BL radial and DP pulses equal bilaterally.  No chest wall tenderness to palpation Resp: Normal respiratory rate and effort. CTAB, no wheezes, rhonchi, crackles.  Abd: Soft, non-tender, non-distended. No rebound tenderness or guarding.  GU: Deferred. MSK: No peripheral edema or signs of trauma. Extremities without deformity or TTP. No cyanosis or clubbing. Skin: warm, dry.  Neuro: A&Ox4, CNs II-XII grossly intact. MAEs. Sensation grossly intact.  Psych: Normal mood and affect.   ED Results / Procedures / Treatments   Labs (all labs ordered are listed, but only abnormal results are displayed) Labs Reviewed  CBC  COMPREHENSIVE METABOLIC PANEL WITH GFR  D-DIMER, QUANTITATIVE  TROPONIN T, HIGH SENSITIVITY  TROPONIN T, HIGH SENSITIVITY    EKG EKG Interpretation Date/Time:  Friday September 04 2024 20:15:32 EST Ventricular Rate:  67 PR Interval:  142 QRS Duration:  96 QT Interval:  388 QTC Calculation: 410 R Axis:   64  Text Interpretation: Sinus rhythm Confirmed by Franklyn Gills [phone]) on 09/04/2024 8:46:46 PM  Radiology DG Chest 2 View Result Date: 09/04/2024 CLINICAL DATA:  Chest pain radiating to shoulders EXAM: CHEST - 2 VIEW COMPARISON:  10/04/2023 FINDINGS: The heart size and mediastinal contours are within normal limits. Both lungs are clear. The visualized skeletal structures are unremarkable. IMPRESSION: No active cardiopulmonary disease. Electronically Signed   By: Ozell Daring M.D.   On: 09/04/2024 20:58    Procedures Procedures    Medications  Ordered in ED Medications - No data to display  ED Course/ Medical Decision Making/ A&P                          Medical Decision Making Amount and/or Complexity of Data Reviewed Labs: ordered. Decision-making details documented in ED Course. Radiology: ordered. Decision-making details documented in ED Course.    This patient presents to the ED for concern of intermittent CP, this involves an extensive number of treatment options, and is a complaint that carries with it a high risk of complications and morbidity.  I considered the following differential and admission for this acute, potentially life threatening condition. Pt is overall very well-appearing, CP free.  MDM:    DDX for chest pain includes but is not limited to: Very low suspicion for ACS vs aortic dissection given presenting sx. Patient cannot PERC out based on age/recent travel, will obtain D dimer and reassess, no signs/sxs of DVT. No c/f dissection. No abdominal pain and no c/f biliary disease.   Clinical Course as of 09/04/24 2201  [place] Sep 04, 2024  2118 WBC: 7.7 No leukocytosis  [HN]  2118 DG Chest 2 View No active cardiopulmonary disease. [HN]  2124 D-Dimer, Quant: 0.36 Neg, reassuring against PE [HN]  2134 Patient continues to be CP free. Low c/f dissection/PE/MI. He has h/o HTN and doesn't currently take medications. HEART score is 2. Recommend f/u with his PCP within 1 week for further w/u and management. DC w/ discharge instructions/return precautions. All questions answered to patient's satisfaction.   [HN]    Clinical Course User Index [HN] Franklyn Gills SAILOR, MD    Labs: I Ordered, and personally interpreted labs.  The pertinent results include: Those listed above  Imaging Studies ordered: I ordered imaging studies including chest x-ray I independently visualized and interpreted imaging. I agree with the radiologist interpretation  Additional history obtained from chart review.    Cardiac  Monitoring: The patient was maintained on a cardiac monitor.  I personally viewed and interpreted the cardiac monitored which showed an underlying rhythm of: Normal sinus rhythm  Reevaluation: I reevaluated the patient and found that his symptoms have :resolved  Social Determinants of Health:  lives independently  Disposition:  DC  Co morbidities that complicate the patient evaluation  Past Medical History:  Diagnosis Date   Heart murmur    Hypertension      Medicines No orders of the defined types were placed in this encounter.   I have reviewed the patients home medicines and have made adjustments as needed  Problem List / ED Course: Problem List Items Addressed This Visit   None Visit Diagnoses       Atypical chest pain    -  Primary                   This note was  created using dictation software, which may contain spelling or grammatical errors.    Franklyn Sid SAILOR, MD 09/04/24 2202  "

## 2024-09-04 NOTE — Discharge Instructions (Addendum)
 Thank you for coming to [facility] Emergency Department. You were seen for intermittent chest pain that had resolved by the time you arrived at the ER. We did an exam, labs, and imaging, and these showed no acute findings.  Your blood pressure was in the 140s in the ER.  You may need to be taking medication for your blood pressure.  Please follow up with your primary care provider within 1 week for further workup and management of your intermittent chest pain from today as well as your high blood pressure.  Do not hesitate to return to the ED or call 911 if you experience: -Worsening symptoms -Shortness of breath, coughing up blood -Exertional chest pain, nausea/vomiting, sweats -Lightheadedness, passing out -Fevers/chills -Anything else that concerns you

## 2024-09-04 NOTE — ED Triage Notes (Signed)
 Chest pain radiating to shoulder blades since 4pm. No SHOB.
